# Patient Record
Sex: Female | Born: 1950
Health system: Southern US, Community
[De-identification: ages and names within clinical notes are randomized; demographics above are authoritative.]

## PROBLEM LIST (undated history)

## (undated) DIAGNOSIS — Z9889 Other specified postprocedural states: Secondary | ICD-10-CM

## (undated) DIAGNOSIS — L4050 Arthropathic psoriasis, unspecified: Secondary | ICD-10-CM

## (undated) DIAGNOSIS — M1990 Unspecified osteoarthritis, unspecified site: Secondary | ICD-10-CM

## (undated) DIAGNOSIS — I609 Nontraumatic subarachnoid hemorrhage, unspecified: Secondary | ICD-10-CM

## (undated) DIAGNOSIS — L409 Psoriasis, unspecified: Secondary | ICD-10-CM

## (undated) DIAGNOSIS — K649 Unspecified hemorrhoids: Secondary | ICD-10-CM

## (undated) DIAGNOSIS — R112 Nausea with vomiting, unspecified: Secondary | ICD-10-CM

## (undated) DIAGNOSIS — K56609 Unspecified intestinal obstruction, unspecified as to partial versus complete obstruction: Secondary | ICD-10-CM

## (undated) DIAGNOSIS — I729 Aneurysm of unspecified site: Secondary | ICD-10-CM

## (undated) HISTORY — PX: ANTERIOR CERVICAL DECOMP/DISCECTOMY FUSION: SHX1161

## (undated) HISTORY — PX: ABDOMINAL HYSTERECTOMY: SHX81

## (undated) HISTORY — DX: Unspecified hemorrhoids: K64.9

## (undated) HISTORY — PX: TONSILLECTOMY: SUR1361

---

## 1994-04-17 DIAGNOSIS — I609 Nontraumatic subarachnoid hemorrhage, unspecified: Secondary | ICD-10-CM

## 1994-04-17 DIAGNOSIS — I729 Aneurysm of unspecified site: Secondary | ICD-10-CM

## 1994-04-17 HISTORY — DX: Nontraumatic subarachnoid hemorrhage, unspecified: I60.9

## 1994-04-17 HISTORY — DX: Aneurysm of unspecified site: I72.9

## 1999-11-17 ENCOUNTER — Ambulatory Visit (HOSPITAL_BASED_OUTPATIENT_CLINIC_OR_DEPARTMENT_OTHER): Admission: RE | Admit: 1999-11-17 | Discharge: 1999-11-17 | Payer: Self-pay | Admitting: Orthopedic Surgery

## 2000-02-23 ENCOUNTER — Encounter: Payer: Self-pay | Admitting: Obstetrics & Gynecology

## 2000-02-23 ENCOUNTER — Encounter: Admission: RE | Admit: 2000-02-23 | Discharge: 2000-02-23 | Payer: Self-pay | Admitting: Obstetrics & Gynecology

## 2001-02-26 ENCOUNTER — Encounter: Admission: RE | Admit: 2001-02-26 | Discharge: 2001-02-26 | Payer: Self-pay | Admitting: Obstetrics & Gynecology

## 2001-02-26 ENCOUNTER — Encounter: Payer: Self-pay | Admitting: Obstetrics & Gynecology

## 2002-03-18 ENCOUNTER — Encounter: Payer: Self-pay | Admitting: Obstetrics & Gynecology

## 2002-03-18 ENCOUNTER — Encounter: Admission: RE | Admit: 2002-03-18 | Discharge: 2002-03-18 | Payer: Self-pay | Admitting: Obstetrics & Gynecology

## 2002-03-26 ENCOUNTER — Encounter: Payer: Self-pay | Admitting: Obstetrics & Gynecology

## 2002-03-26 ENCOUNTER — Encounter: Admission: RE | Admit: 2002-03-26 | Discharge: 2002-03-26 | Payer: Self-pay | Admitting: Obstetrics & Gynecology

## 2003-06-05 ENCOUNTER — Encounter: Admission: RE | Admit: 2003-06-05 | Discharge: 2003-06-05 | Payer: Self-pay | Admitting: Obstetrics & Gynecology

## 2005-05-24 ENCOUNTER — Observation Stay (HOSPITAL_COMMUNITY): Admission: EM | Admit: 2005-05-24 | Discharge: 2005-05-27 | Payer: Self-pay | Admitting: Emergency Medicine

## 2005-05-24 DIAGNOSIS — K56609 Unspecified intestinal obstruction, unspecified as to partial versus complete obstruction: Secondary | ICD-10-CM

## 2005-05-24 HISTORY — DX: Unspecified intestinal obstruction, unspecified as to partial versus complete obstruction: K56.609

## 2005-05-26 ENCOUNTER — Observation Stay (HOSPITAL_COMMUNITY): Admission: EM | Admit: 2005-05-26 | Discharge: 2005-05-27 | Payer: Self-pay | Admitting: Internal Medicine

## 2006-09-11 ENCOUNTER — Other Ambulatory Visit: Admission: RE | Admit: 2006-09-11 | Discharge: 2006-09-11 | Payer: Self-pay | Admitting: Geriatric Medicine

## 2007-01-02 ENCOUNTER — Encounter: Admission: RE | Admit: 2007-01-02 | Discharge: 2007-01-02 | Payer: Self-pay | Admitting: Geriatric Medicine

## 2010-09-02 NOTE — Discharge Summary (Signed)
Kylie Peterson, Kylie Peterson               ACCOUNT NO.:  [id number]   MEDICAL RECORD NO.:  [id number]          PATIENT TYPE:  INP   LOCATION:  3028                         FACILITY:  [facility]   PHYSICIAN:  Melissa L. Ladona Ridgel, MD  DATE OF BIRTH:  [birth year]   DATE OF ADMISSION:  05/24/2005  DATE OF DISCHARGE:  05/27/2005                                 DISCHARGE SUMMARY   CHIEF COMPLAINT:  Crampy abdominal pain.   DISCHARGE DIAGNOSIS:  Small-bowel obstruction, possible volvulus.   HOSPITAL COURSE:  The patient was admitted on May 24, 2005 after  developing the onset of acute abdominal pain described as crampy in nature.  Her symptoms started on May 20, 2005.  She had waxing and waning  symptoms until May 22, 2005, when she developed nausea and vomiting.  By  May 23, 2005, she felt better, but then again developed crampy abdominal  pain and came to the emergency room for further evaluation.  In the  emergency room, a CAT scan was completed, which showed a small-bowel  obstruction.  She was admitted to the general medical floor with placement  of an NG tube for decompression, and was made n.p.o.  After 24 hours of  bowel rest, the patient continued to have minimal diffuse abdominal  discomfort, but continued to improve.  Her NG tube was clamped, which she  tolerated well.  She was trialed on clear liquids, which again she continued  to tolerate.  The patient was seen and evaluated by [facility] to determine  whether a procedure would be diagnostically appropriate, especially in light  of the fact that she possibly had a cecal volvulus.  It was determined that  this could be done as an outpatient.  Her NG tube was removed and she was  started back on a regular diet.  The patient had no difficulty with her  advancement of diet.  However, on the day of discharge, she did have an  episode of hypotension without significant symptomatology; however, she was  kept further during that day  for IV fluid rehydration.  In the evening she  was reevaluated and it was determined that she could discharge to home with  followup to Dr. Merlene Laughter, who is her primary care physician.   On that day of discharge, her temperature was 98.1.  Her discharging blood  pressure was between 90-110/58-70.  She was asymptomatic with regard to this  and therefore she was discharged to home.   As stated, she was afebrile at the time of discharge.   PHYSICAL EXAMINATION:  GENERAL:  A well-developed, well-nourished white  female in no acute distress.  HEENT:  She is normocephalic, atraumatic.  Pupils are equal, round and  reactive to light.  Extraocular muscles were intact.  Mucous membranes were  moist.  NECK:  Supple.  There is no JVD, no lymph nodes, no carotid bruits.  CHEST:  Her chest was clear to auscultation.  There were no rales, rhonchi  or wheezes.  CARDIOVASCULAR:  Regular rate and rhythm, positive S1 and S2, no S3 or S4,  no murmurs, rubs,  or gallops.  ABDOMEN:  Soft.  She has trace tenderness in the left lower quadrant without  evidence for hernia or guarding.  EXTREMITIES:  Extremities show no clubbing or cyanosis.   LABORATORY AND ACCESSORY CLINICAL DATA:  As stated, the pertinent studies  for her visits were a CAT scan showing a small-bowel obstruction.   Her discharging sodium was 140, potassium 4.1, BUN was 2, creatinine was  0.9.  Her white count had remained slightly elevated without fever at 15.3,  hemoglobin of 12.4, hematocrit 35.2; platelets were 375,000.   MEDICATIONS AT TIME OF DISCHARGE:  None.   FOLLOWUP:  She was requested to follow up with Dr. Pete Glatter if she were to  become more symptomatic and I will request that she see him in the next week  to follow up on her elevated white cell count.   CONDITION ON DISCHARGE:  At the time of discharge, the patient is deemed  stable.      Melissa L. Ladona Ridgel, MD  Electronically Signed     MLT/MEDQ  D:   05/31/2005  T:  05/31/2005  Job:  [id number]   cc:   Kylie Peterson, M.D.  Fax: [phone]

## 2010-09-02 NOTE — Op Note (Signed)
[place]  Patient:    Kylie Peterson, Kylie Peterson                        MRN: [id number] Proc. Date: 11/17/99 Adm. Date:  [id number] Disc. Date: [id number] Attending:  Ronne Binning CC:         Nicki Reaper, M.D. (2)                           Operative Report  PREOPERATIVE DIAGNOSIS:  Mass, right index-middle web space.  POSTOPERATIVE DIAGNOSIS:  Mass, right index-middle web space.  OPERATION:  Excision of mass second web space, right hand.  SURGEON:  Nicki Reaper, M.D.  ASSISTANT:  Joaquin Courts, R.N.  ANESTHESIA:  Forearm-based IV regional.  ANESTHESIOLOGIST:  Kaylyn Layer. Michelle Piper, M.D.  INDICATIONS:  The patient is a 60 year old female with a history of a mass in the second web space of her right hand.  DESCRIPTION OF PROCEDURE:  The patient was brought to the operating room where a forearm-based IV regional anesthetic was carried out without difficulty. She was prepped and draped using Betadine scrub and solution, with the right arm free.  A longitudinal incision was made over the mass and carried down through the subcutaneous tissue.  Bleeders were electrocauterized.  A multilobulated cystic structure was immediately apparent.  With blunt and sharp dissection this was dissected free and found to enter into the middle finger metacarpal area.  This area was debrided, coagulated.  No further lesions were identified.  The wound was irrigated.  The skin was closed with interrupted #5-0 nylon sutures.  A sterile compressive dressing without a splint was applied.  The patient tolerated the procedure well and was taken to the recovery room for observation in satisfactory condition.  DISPOSITION:  She is discharged home, to return to the [facility] [place] in one week, on Vicodin and Keflex.DD:  11/17/99 TD:  11/19/99 Job: [id number]

## 2010-09-02 NOTE — Consult Note (Signed)
[name], Peterson               ACCOUNT NO.:  [id number]   MEDICAL RECORD NO.:  [id number]          PATIENT TYPE:  EMS   LOCATION:  ED                           FACILITY:  [facility]   PHYSICIAN:  John C. Madilyn Fireman, M.D.    DATE OF BIRTH:  14-Jul-1950   DATE OF CONSULTATION:  05/26/2005  DATE OF DISCHARGE:                                   CONSULTATION   REASON FOR CONSULTATION:  Small bowel obstruction.   HISTORY OF PRESENT ILLNESS:  Patient is a 60 year old white female who  presented with crampy abdominal pain on February 3 with nausea which  resolved but it recurred on February 6 and became worse and associated with  vomiting on February 7 with no diarrhea and no fever.  She did not have  significant abdominal distention and presented to the emergency room where  an abdominal CT scan showed a mechanical small bowel obstruction, no obvious  cause.  NG tube was placed which resulted in good decompression of her  abdomen and on February 8 small bowel obstruction seemed to be resolved by  KUB with some contrast medium in the vicinity of the cecum.  Between the two  studies a volvulus was suspected.  Patient's NG tube was removed today and  her abdomen is currently flat with minimal residual soreness and she has  passed some gas.   PAST MEDICAL HISTORY:  History of subarachnoid hemorrhage.   PAST SURGICAL HISTORY:  Hysterectomy for endometriosis.   ALLERGIES:  CODEINE.   MEDICATIONS:  None.   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  She denies  alcohol or tobacco use.   PHYSICAL EXAMINATION:  GENERAL:  Well-developed, well-nourished white female  in no acute distress.  HEART:  Regular rate and rhythm without murmur.  LUNGS:  Clear.  ABDOMEN:  Soft, nondistended with normoactive bowel sounds.  No  hepatosplenomegaly, mass, or guarding.   IMPRESSION:  Small bowel obstruction, resolved with volvulus suggested.   PLAN:  To rule out other etiologies and satisfy   colon cancer screening  recommendations she clearly warrants colonoscopy.  This can be done  optionally either as an inpatient or as an outpatient, particularly if she  improves and wants to go home as soon as possible.  Discussed all this with  her and will advance diet and as long as she improves will discharge when  clinically ready and follow up in my office and arrange a colonoscopy in the  near future.           ______________________________  Everardo All Madilyn Fireman, M.D.     JCH/MEDQ  D:  05/26/2005  T:  05/26/2005  Job:  [id number]   cc:   Hal T. Stoneking, M.D.  Fax: [phone]

## 2010-09-02 NOTE — H&P (Signed)
NAMESTEFANEE, Kylie Peterson               ACCOUNT NO.:  [id number]   MEDICAL RECORD NO.:  [id number]          PATIENT TYPE:  INP   LOCATION:  0109                         [facility]ACILITY:  [facility]   PHYSICIAN:  Theressa Millard, M.D.    DATE OF BIRTH:  December 24, 1950   DATE OF ADMISSION:  05/24/2005  DATE OF DISCHARGE:                                HISTORY & PHYSICAL   HISTORY OF PRESENT ILLNESS:  Kylie Peterson is a 60 year old white female  admitted with a small bowel obstruction.  She developed crampy abdominal  pain on May 20, 2005.  On May 21, 2005, she felt somewhat better,  but that evening she got worse.  On May 22, 2005, she developed nausea  and vomiting, and vomited approximately 6 times.  On May 23, 2005, she  felt somewhat better, but earlier today developed nausea without vomiting,  and then had a trace abdominal pain that was crampy and came to the  emergency department.  CT scan of the abdomen was consistent with a small  bowel obstruction.  She has had no flatus today, no diarrhea.   PAST MEDICAL HISTORY:  1.  Hysterectomy for endometriosis.  2.  Subarachnoid hemorrhage with right-sided weakness, which was resolved.      No source of subarachnoid hemorrhage found.   ALLERGIES:  CODEINE which causes nausea and vomiting.   MEDICATIONS:  She is on no chronic medications.   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  She does not  smoke.  She does not consume alcohol.   FAMILY HISTORY:  Negative.   REVIEW OF SYSTEMS:  All other systems are negative.   PHYSICAL EXAMINATION:  VITAL SIGNS:  Blood pressure 115/54, pulse 90,  respiratory rate 22, oxygen saturation 99%.  HEENT:  The eyes have pupils which are round and reactive to light.  Ears,  nose and throat are unremarkable.  NECK:  Supple.  Thyroid is not enlarged or tender.  CHEST:  Clear to auscultation and percussion.  CARDIAC:  A normal S1 and S2 without an S3, S4, murmur, rub, or click.  ABDOMEN:   Slightly decreased bowel sounds.  There is mild diffuse  tenderness, somewhat worse in the lower abdomen.  There is no rebound.  EXTREMITIES:  Without clubbing, cyanosis, or edema.   LABORATORY DATA:  CBC is normal.  Sodium of 135, potassium 3.9, BUN 12,  creatinine 0.8.  CT scan shows nondistended loops of small bowel consistent  with a small bowel obstruction.   IMPRESSION:  Small bowel obstruction.  CT consistent with this, although  abdomen is somewhat quiet (on the other hand, she has had pain medications).  Will place a nasogastric tube to low intermittent suction, start IV fluids,  follow x-rays and electrolytes.      Theressa Millard, M.D.  Electronically Signed     JO/MEDQ  D:  05/24/2005  T:  05/24/2005  Job:  [id number]

## 2011-10-24 ENCOUNTER — Encounter (HOSPITAL_BASED_OUTPATIENT_CLINIC_OR_DEPARTMENT_OTHER): Payer: Self-pay | Admitting: *Deleted

## 2011-10-24 ENCOUNTER — Other Ambulatory Visit: Payer: Self-pay | Admitting: Orthopedic Surgery

## 2011-10-24 NOTE — Progress Notes (Addendum)
Requested notes from Dr.Love's office about subarachnoid hemorrhage and they have nothing on pt. Checked E chart for notes and none available from admission for Hemorrhage.

## 2011-10-25 ENCOUNTER — Encounter (HOSPITAL_BASED_OUTPATIENT_CLINIC_OR_DEPARTMENT_OTHER): Payer: Self-pay | Admitting: *Deleted

## 2011-10-25 ENCOUNTER — Encounter (HOSPITAL_BASED_OUTPATIENT_CLINIC_OR_DEPARTMENT_OTHER)
Admission: RE | Disposition: A | Discharge status: Home / Self Care | Payer: Self-pay | Source: Ambulatory Visit | Attending: Orthopedic Surgery

## 2011-10-25 ENCOUNTER — Ambulatory Visit (HOSPITAL_BASED_OUTPATIENT_CLINIC_OR_DEPARTMENT_OTHER): Payer: BC Managed Care – PPO | Admitting: Certified Registered Nurse Anesthetist

## 2011-10-25 ENCOUNTER — Encounter (HOSPITAL_BASED_OUTPATIENT_CLINIC_OR_DEPARTMENT_OTHER): Payer: Self-pay | Admitting: Certified Registered Nurse Anesthetist

## 2011-10-25 ENCOUNTER — Encounter (HOSPITAL_BASED_OUTPATIENT_CLINIC_OR_DEPARTMENT_OTHER): Payer: Self-pay | Admitting: Orthopedic Surgery

## 2011-10-25 ENCOUNTER — Ambulatory Visit (HOSPITAL_BASED_OUTPATIENT_CLINIC_OR_DEPARTMENT_OTHER)
Admission: RE | Admit: 2011-10-25 | Discharge: 2011-10-25 | Disposition: A | Discharge status: Home / Self Care | Payer: BC Managed Care – PPO | Source: Ambulatory Visit | Attending: Orthopedic Surgery | Admitting: Orthopedic Surgery

## 2011-10-25 DIAGNOSIS — M19049 Primary osteoarthritis, unspecified hand: Secondary | ICD-10-CM | POA: Insufficient documentation

## 2011-10-25 DIAGNOSIS — Z8673 Personal history of transient ischemic attack (TIA), and cerebral infarction without residual deficits: Secondary | ICD-10-CM | POA: Insufficient documentation

## 2011-10-25 DIAGNOSIS — M6740 Ganglion, unspecified site: Secondary | ICD-10-CM | POA: Insufficient documentation

## 2011-10-25 DIAGNOSIS — M1990 Unspecified osteoarthritis, unspecified site: Secondary | ICD-10-CM | POA: Insufficient documentation

## 2011-10-25 HISTORY — DX: Other specified postprocedural states: Z98.890

## 2011-10-25 HISTORY — DX: Nontraumatic subarachnoid hemorrhage, unspecified: I60.9

## 2011-10-25 HISTORY — DX: Unspecified osteoarthritis, unspecified site: M19.90

## 2011-10-25 HISTORY — DX: Unspecified intestinal obstruction, unspecified as to partial versus complete obstruction: K56.609

## 2011-10-25 HISTORY — DX: Nausea with vomiting, unspecified: R11.2

## 2011-10-25 HISTORY — PX: EAR CYST EXCISION: SHX22

## 2011-10-25 LAB — POCT HEMOGLOBIN-HEMACUE: Hemoglobin: 13.3 g/dL (ref 12.0–15.0)

## 2011-10-25 SURGERY — CYST REMOVAL
Anesthesia: General | Site: Finger | Laterality: Right | Wound class: Clean

## 2011-10-25 MED ORDER — DEXAMETHASONE SODIUM PHOSPHATE 4 MG/ML IJ SOLN
INTRAMUSCULAR | Status: DC | PRN
  Administered 2011-10-25: 10 mg via INTRAVENOUS

## 2011-10-25 MED ORDER — LIDOCAINE HCL (CARDIAC) 20 MG/ML IV SOLN
INTRAVENOUS | Status: DC | PRN
  Administered 2011-10-25: 60 mg via INTRAVENOUS

## 2011-10-25 MED ORDER — PROPOFOL 10 MG/ML IV EMUL: INTRAVENOUS | Status: DC | PRN

## 2011-10-25 MED ORDER — LACTATED RINGERS IV SOLN
INTRAVENOUS | Status: DC
  Administered 2011-10-25 (×2): via INTRAVENOUS

## 2011-10-25 MED ORDER — FENTANYL CITRATE 0.05 MG/ML IJ SOLN
INTRAMUSCULAR | Status: DC | PRN
  Administered 2011-10-25: 50 ug via INTRAVENOUS

## 2011-10-25 MED ORDER — PENTAZOCINE-NALOXONE 50-0.5 MG PO TABS: 1.0000 | ORAL_TABLET | ORAL | Status: AC | PRN

## 2011-10-25 MED ORDER — 0.9 % SODIUM CHLORIDE (POUR BTL) OPTIME
TOPICAL | Status: DC | PRN
  Administered 2011-10-25: 100 mL

## 2011-10-25 MED ORDER — CEFAZOLIN SODIUM 1-5 GM-% IV SOLN
INTRAVENOUS | Status: DC | PRN
  Administered 2011-10-25: 1 g via INTRAVENOUS

## 2011-10-25 MED ORDER — FENTANYL CITRATE 0.05 MG/ML IJ SOLN: 25.0000 ug | INTRAMUSCULAR | Status: DC | PRN

## 2011-10-25 MED ORDER — CHLORHEXIDINE GLUCONATE 4 % EX LIQD: 60.0000 mL | Freq: Once | CUTANEOUS | Status: DC

## 2011-10-25 MED ORDER — DROPERIDOL 2.5 MG/ML IJ SOLN
INTRAMUSCULAR | Status: DC | PRN
  Administered 2011-10-25: 0.625 mg via INTRAVENOUS

## 2011-10-25 MED ORDER — BUPIVACAINE HCL (PF) 0.25 % IJ SOLN
INTRAMUSCULAR | Status: DC | PRN
  Administered 2011-10-25: 7 mL

## 2011-10-25 MED ORDER — ONDANSETRON HCL 4 MG/2ML IJ SOLN
INTRAMUSCULAR | Status: DC | PRN
  Administered 2011-10-25: 4 mg via INTRAVENOUS

## 2011-10-25 MED ORDER — PROPOFOL 10 MG/ML IV BOLUS
INTRAVENOUS | Status: DC | PRN
  Administered 2011-10-25: 200 mg via INTRAVENOUS

## 2011-10-25 MED ORDER — MIDAZOLAM HCL 5 MG/5ML IJ SOLN
INTRAMUSCULAR | Status: DC | PRN
  Administered 2011-10-25: 1 mg via INTRAVENOUS

## 2011-10-25 SURGICAL SUPPLY — 50 items
BANDAGE COBAN STERILE 2 (GAUZE/BANDAGES/DRESSINGS) IMPLANT
BANDAGE GAUZE ELAST BULKY 4 IN (GAUZE/BANDAGES/DRESSINGS) IMPLANT
BLADE MINI RND TIP GREEN BEAV (BLADE) ×3 IMPLANT
BLADE SURG 15 STRL LF DISP TIS (BLADE) ×2 IMPLANT
BLADE SURG 15 STRL SS (BLADE) ×1
BNDG COHESIVE 1X5 TAN STRL LF (GAUZE/BANDAGES/DRESSINGS) ×3 IMPLANT
BNDG COHESIVE 3X5 TAN STRL LF (GAUZE/BANDAGES/DRESSINGS) IMPLANT
BNDG ESMARK 4X9 LF (GAUZE/BANDAGES/DRESSINGS) ×3 IMPLANT
CHLORAPREP W/TINT 26ML (MISCELLANEOUS) ×3 IMPLANT
CLOTH BEACON ORANGE TIMEOUT ST (SAFETY) ×3 IMPLANT
CORDS BIPOLAR (ELECTRODE) ×3 IMPLANT
COVER MAYO STAND STRL (DRAPES) ×3 IMPLANT
COVER TABLE BACK 60X90 (DRAPES) ×3 IMPLANT
CUFF TOURNIQUET SINGLE 18IN (TOURNIQUET CUFF) ×3 IMPLANT
DECANTER SPIKE VIAL GLASS SM (MISCELLANEOUS) IMPLANT
DRAIN PENROSE 1/2X12 LTX STRL (WOUND CARE) IMPLANT
DRAPE EXTREMITY T 121X128X90 (DRAPE) ×3 IMPLANT
DRAPE SURG 17X23 STRL (DRAPES) ×3 IMPLANT
GAUZE XEROFORM 1X8 LF (GAUZE/BANDAGES/DRESSINGS) ×3 IMPLANT
GLOVE BIO SURGEON STRL SZ 6.5 (GLOVE) ×3 IMPLANT
GLOVE BIOGEL PI IND STRL 7.0 (GLOVE) ×2 IMPLANT
GLOVE BIOGEL PI INDICATOR 7.0 (GLOVE) ×1
GLOVE SURG ORTHO 8.0 STRL STRW (GLOVE) ×3 IMPLANT
GOWN BRE IMP PREV XXLGXLNG (GOWN DISPOSABLE) ×3 IMPLANT
GOWN PREVENTION PLUS XLARGE (GOWN DISPOSABLE) ×3 IMPLANT
NDL SAFETY ECLIPSE 18X1.5 (NEEDLE) IMPLANT
NEEDLE 27GAX1X1/2 (NEEDLE) ×3 IMPLANT
NEEDLE HYPO 18GX1.5 SHARP (NEEDLE)
NS IRRIG 1000ML POUR BTL (IV SOLUTION) ×3 IMPLANT
PACK BASIN DAY SURGERY FS (CUSTOM PROCEDURE TRAY) ×3 IMPLANT
PAD CAST 3X4 CTTN HI CHSV (CAST SUPPLIES) IMPLANT
PADDING CAST ABS 3INX4YD NS (CAST SUPPLIES)
PADDING CAST ABS 4INX4YD NS (CAST SUPPLIES)
PADDING CAST ABS COTTON 3X4 (CAST SUPPLIES) IMPLANT
PADDING CAST ABS COTTON 4X4 ST (CAST SUPPLIES) IMPLANT
PADDING CAST COTTON 3X4 STRL (CAST SUPPLIES)
SPLINT FINGER 5/8X3.25 (SOFTGOODS) ×2 IMPLANT
SPLINT FINGER FOAM 3 9119 05 (SOFTGOODS) ×3
SPLINT PLASTER CAST XFAST 3X15 (CAST SUPPLIES) IMPLANT
SPLINT PLASTER XTRA FASTSET 3X (CAST SUPPLIES)
SPONGE GAUZE 4X4 12PLY (GAUZE/BANDAGES/DRESSINGS) ×6 IMPLANT
STOCKINETTE 4X48 STRL (DRAPES) ×3 IMPLANT
SUT VIC AB 4-0 P2 18 (SUTURE) IMPLANT
SUT VICRYL RAPID 5 0 P 3 (SUTURE) ×3 IMPLANT
SUT VICRYL RAPIDE 4/0 PS 2 (SUTURE) IMPLANT
SYR BULB 3OZ (MISCELLANEOUS) ×3 IMPLANT
SYR CONTROL 10ML LL (SYRINGE) ×3 IMPLANT
TOWEL OR 17X24 6PK STRL BLUE (TOWEL DISPOSABLE) ×3 IMPLANT
UNDERPAD 30X30 INCONTINENT (UNDERPADS AND DIAPERS) ×3 IMPLANT
WATER STERILE IRR 1000ML POUR (IV SOLUTION) IMPLANT

## 2011-10-25 NOTE — Anesthesia Procedure Notes (Signed)
Procedure Name: LMA Insertion Date/Time: 10/25/2011 10:10 AM Performed by: Beulah Matusek D Pre-anesthesia Checklist: Patient identified, Emergency Drugs available, Suction available and Patient being monitored Patient Re-evaluated:Patient Re-evaluated prior to inductionOxygen Delivery Method: Circle System Utilized Preoxygenation: Pre-oxygenation with 100% oxygen Intubation Type: IV induction Ventilation: Mask ventilation without difficulty LMA: LMA inserted LMA Size: 4.0 Number of attempts: 1 Airway Equipment and Method: bite block Placement Confirmation: positive ETCO2 Tube secured with: Tape Dental Injury: Teeth and Oropharynx as per pre-operative assessment

## 2011-10-25 NOTE — Anesthesia Postprocedure Evaluation (Signed)
  Anesthesia Post-op Note  Patient: Kylie Peterson  Procedure(s) Performed: Procedure(s) (LRB): CYST REMOVAL (Right)  Patient Location: PACU  Anesthesia Type: General  Level of Consciousness: awake and alert   Airway and Oxygen Therapy: Patient Spontanous Breathing and Patient connected to face mask oxygen  Post-op Pain: none  Post-op Assessment: Post-op Vital signs reviewed, Patient's Cardiovascular Status Stable, Respiratory Function Stable, Patent Airway and No signs of Nausea or vomiting  Post-op Vital Signs: Reviewed and stable  Complications: No apparent anesthesia complications

## 2011-10-25 NOTE — Anesthesia Preprocedure Evaluation (Signed)
Anesthesia Evaluation  Patient identified by MRN, date of birth, ID band Patient awake    Reviewed: Allergy & Precautions, H&P , NPO status , Patient's Chart, lab work & pertinent test results  History of Anesthesia Complications (+) PONV  Airway Mallampati: II TM Distance: >3 FB Neck ROM: Full    Dental No notable dental hx. (+) Teeth Intact and Dental Advisory Given   Pulmonary neg pulmonary ROS,  breath sounds clear to auscultation  Pulmonary exam normal       Cardiovascular negative cardio ROS  Rhythm:Regular Rate:Normal     Neuro/Psych negative neurological ROS  negative psych ROS   GI/Hepatic negative GI ROS, Neg liver ROS,   Endo/Other  negative endocrine ROS  Renal/GU negative Renal ROS  negative genitourinary   Musculoskeletal   Abdominal   Peds  Hematology negative hematology ROS (+)   Anesthesia Other Findings   Reproductive/Obstetrics negative OB ROS                           Anesthesia Physical Anesthesia Plan  ASA: I  Anesthesia Plan: General   Post-op Pain Management:    Induction: Intravenous  Airway Management Planned: LMA  Additional Equipment:   Intra-op Plan:   Post-operative Plan: Extubation in OR  Informed Consent: I have reviewed the patients History and Physical, chart, labs and discussed the procedure including the risks, benefits and alternatives for the proposed anesthesia with the patient or authorized representative who has indicated his/her understanding and acceptance.   Dental advisory given  Plan Discussed with: CRNA  Anesthesia Plan Comments:         Anesthesia Quick Evaluation  

## 2011-10-25 NOTE — Brief Op Note (Signed)
10/25/2011  10:35 AM  PATIENT:  Kylie Peterson  61 y.o. female  PRE-OPERATIVE DIAGNOSIS:  Mucoid cyst right middle finger distal interphalangeal joint  POST-OPERATIVE DIAGNOSIS:  Mucoid cyst right middle finger distal interphalangeal joint  PROCEDURE:  Procedure(s) (LRB): CYST REMOVAL (Right)  SURGEON:  Surgeon(s) and Role:    * Nicki Reaper, MD - Primary  PHYSICIAN ASSISTANT:   ASSISTANTS: none   ANESTHESIA:   local and general  EBL:  Total I/O In: 1000 [I.V.:1000] Out: -   BLOOD ADMINISTERED:none  DRAINS: none   LOCAL MEDICATIONS USED:  MARCAINE     SPECIMEN:  Excision  DISPOSITION OF SPECIMEN:  PATHOLOGY  COUNTS:  YES  TOURNIQUET:   Total Tourniquet Time Documented: Forearm (Right) - 15 minutes  DICTATION: .Other Dictation: Dictation Number [id number]  PLAN OF CARE: Discharge to home after PACU  PATIENT DISPOSITION:  PACU - hemodynamically stable.

## 2011-10-25 NOTE — Op Note (Signed)
[name], [name]               ACCOUNT NO.:  [id number]  MEDICAL RECORD NO.:  [id number]  LOCATION:                                 FACILITY:  PHYSICIAN:  Cindee Salt, M.D.            DATE OF BIRTH:  DATE OF PROCEDURE:  10/25/2011 DATE OF DISCHARGE:                              OPERATIVE REPORT   PREOPERATIVE DIAGNOSES:  Mucoid cyst, degenerative arthritis, distal interphalangeal joint, right middle finger.  POSTOPERATIVE DIAGNOSES:  Mucoid cyst, degenerative arthritis, distal interphalangeal joint, right middle finger.  OPERATION:  Excision of mucoid cyst, debridement of distal interphalangeal joint, right middle finger.  SURGEON:  Cindee Salt, MD  ANESTHESIA:  General with metacarpal block.  ANESTHESIOLOGIST:  Zenon Mayo, MD  HISTORY:  The patient is a 61 year old female with a history of a large mucoid cyst, distal interphalangeal joint of her left middle finger. She is desirous of having this excised.  Pre, peri, and postoperative course have been discussed along with risks and complications.  She is aware there is no guarantee with the surgery, possibility of infection, recurrence, injury to arteries, nerves, tendons, incomplete relief of symptoms, dystrophy.  In the preoperative area, the patient is seen, the extremity marked by both the patient and surgeon.  Antibiotic given.  DESCRIPTION OF PROCEDURE:  The patient was brought to the operating room where a general anesthetic was carried out without difficulty.  She was prepped using ChloraPrep, supine position, right arm free.  A 3-minute dry time was allowed.  Time-out taken, confirming the patient and procedure.  The limb was exsanguinated with an Esmarch bandage. Tourniquet was inflated to 250 mmHg.  A curvilinear incision was made over the distal interphalangeal joint cyst, carried down along the lateral margin of the finger, carried down through subcutaneous tissue. Bleeders were electrocauterized  with bipolar.  The cyst was immediately encountered beneath the skin.  Blunt and sharp dissection, this was dissected free.  The joint was opened.  Synovectomy performed and debridement of osteophytes performed.  The specimen was sent to Pathology.  The wound was irrigated.  The skin then closed with interrupted 5-0 Vicryl Rapide sutures.  Metacarpal block was then given with 0.25% Marcaine without epinephrine, 7 mL was used.  A sterile compressive dressing, splint to the distal interphalangeal joint was applied.  On deflation of the tourniquet, remaining fingers turned pinked.  She was taken to the recovery room for observation in a satisfactory condition.          ______________________________ Cindee Salt, M.D.     GK/MEDQ  D:  10/25/2011  T:  10/25/2011  Job:  [id number]

## 2011-10-25 NOTE — Op Note (Signed)
Dictated number: [id number]

## 2011-10-25 NOTE — Transfer of Care (Signed)
Immediate Anesthesia Transfer of Care Note  Patient: Kylie Peterson  Procedure(s) Performed: Procedure(s) (LRB): CYST REMOVAL (Right)  Patient Location: PACU  Anesthesia Type: General  Level of Consciousness: awake and patient cooperative  Airway & Oxygen Therapy: Patient Spontanous Breathing and Patient connected to face mask oxygen  Post-op Assessment: Report given to PACU RN and Post -op Vital signs reviewed and stable  Post vital signs: Reviewed and stable  Complications: No apparent anesthesia complications

## 2011-10-25 NOTE — H&P (Signed)
  Kylie Peterson is a 61 year old right hand dominant female referred by Dr. Herb Grays for a consultation with respect to a mass in her right middle finger DIP joint. This has been present for approximately 2 months. She recalls no history of injury. She is complaining of this increasing in discomfort. She has a history of arthritis. She has no history of diabetes, thyroid problems or gout. She has had a fusion of her cervical spine and a history of subarachnoid hemorrhage seen by Dr. Newell Coral.   Past Medical History: She has an allergy to Codeine. She takes no medicines. She has had no surgery other than the surgical fusion.   Family Medical History: Positive for arthritis.  Social History: She does not smoke or drink. She is married and a Management consultant.  Review of Systems: Positive for glasses, ringing in her ears, and stroke, otherwise negative for 14 points.  Kylie Peterson is an 61 y.o. female.   Chief Complaint: mucoid tumor rmr HPI: see above  Past Medical History  Diagnosis Date  . Arthritis     osteoarthritis  . PONV (postoperative nausea and vomiting)   . Subarachnoid hemorrhage     at age 34- has some weakness on right side( not really any residual)  . Small bowel obstruction     05/24/2005    Past Surgical History  Procedure Date  . Anterior cervical decomp/discectomy fusion   . Abdominal hysterectomy     1986  . Tonsillectomy     30 years ago    History reviewed. No pertinent family history. Social History:  reports that she has never smoked. She does not have any smokeless tobacco history on file. She reports that she drinks alcohol. She reports that she does not use illicit drugs.  Allergies:  Allergies  Allergen Reactions  . Darvocet (Propoxyphene-Acetaminophen) Nausea And Vomiting  . Percocet (Oxycodone-Acetaminophen) Nausea And Vomiting  . Vicodin (Hydrocodone-Acetaminophen) Nausea And Vomiting    No prescriptions prior to admission    No results found  for this or any previous visit (from the past 48 hour(s)).  No results found.   Pertinent items are noted in HPI.  Height 5\' 2"  (1.575 m), weight 58.968 kg (130 lb).  General appearance: alert, cooperative and appears stated age Head: Normocephalic, without obvious abnormality Neck: no adenopathy Resp: clear to auscultation bilaterally Cardio: regular rate and rhythm, S1, S2 normal, no murmur, click, rub or gallop GI: soft, non-tender; bowel sounds normal; no masses,  no organomegaly Extremities: extremities normal, atraumatic, no cyanosis or edema Pulses: 2+ and symmetric Skin: Skin color, texture, turgor normal. No rashes or lesions Neurologic: Grossly normal Incision/Wound: na  Assessment/Plan She would like to proceed with excision of mucoid cyst, debridement of distal interphalangeal joint right middle finger.  She is aware there is no guarantee with the surgery, possibility of infection, recurrence, injury to arteries, nerves, tendons, incomplete relief of symptoms and dystrophy.  She would like to proceed.   She is scheduled for excision mucoid cyst with debridement of the joint as an outpatient.  Kylie Peterson R 10/25/2011, 9:24 AM

## 2011-10-27 ENCOUNTER — Encounter (HOSPITAL_BASED_OUTPATIENT_CLINIC_OR_DEPARTMENT_OTHER): Payer: Self-pay | Admitting: Orthopedic Surgery

## 2013-02-19 ENCOUNTER — Encounter: Payer: Self-pay | Admitting: Internal Medicine

## 2013-03-19 ENCOUNTER — Other Ambulatory Visit (INDEPENDENT_AMBULATORY_CARE_PROVIDER_SITE_OTHER): Payer: BC Managed Care – PPO

## 2013-03-19 ENCOUNTER — Encounter: Payer: Self-pay | Admitting: Internal Medicine

## 2013-03-19 ENCOUNTER — Ambulatory Visit (INDEPENDENT_AMBULATORY_CARE_PROVIDER_SITE_OTHER): Payer: BC Managed Care – PPO | Admitting: Internal Medicine

## 2013-03-19 VITALS — BP 100/70 | HR 88 | Ht 63.0 in | Wt 123.0 lb

## 2013-03-19 DIAGNOSIS — R1084 Generalized abdominal pain: Secondary | ICD-10-CM

## 2013-03-19 LAB — LIPASE: Lipase: 16 U/L (ref 11.0–59.0)

## 2013-03-19 MED ORDER — MOVIPREP 100 G PO SOLR: 1.0000 | Freq: Once | ORAL | Status: DC

## 2013-03-19 MED ORDER — HYOSCYAMINE SULFATE 0.125 MG SL SUBL: SUBLINGUAL_TABLET | SUBLINGUAL | Status: DC

## 2013-03-19 NOTE — Progress Notes (Signed)
HISTORY OF PRESENT ILLNESS:  Kylie Peterson is a 62 y.o. female with the below listed medical history who is referred today regarding a 2 month history of abdominal pain. The patient reports having undergone screening colonoscopy at age 61 without significant abnormalities. She was admitted to the hospital in February 2007 with acute abdominal pain and radiographic evidence of a mid small bowel obstruction either on the basis of adhesions or mid gut volvulus. This was treated conservatively and resolved. She does take meloxicam daily for psoriatic arthritis. As well, PPI in the form of omeprazole 20 mg, sporadically, for GI gut prophylaxis. She tells her that she was well until approximately 2 months ago when she developed upper abdominal pain described as sharp waves. The discomfort may occur before meals or after meals. This been quite intense on 3 occasions lasting for hours and associated with vomiting. She felt better after vomiting the first 2 occasions. The last such episode, yesterday, could not resolve improvement. Today she feels "sore". The discomfort generally begins in the upper abdomen but radiates throughout the entire abdomen thereafter. There is also some radiation into the back. She was seen by Dr. Collins Scotland October 22. Routine blood work was obtained as was urinalysis. This was unremarkable. Abdominal ultrasound obtained 02/06/2013 was negative including normal gallbladder. She has had 5 pound weight loss since the onset of symptoms. She notices the pain to some degree daily. Will wake her at night. Increased intestinal gas. She reports a history of chronic stable constipation. More recently loose stools.  REVIEW OF SYSTEMS:  All non-GI ROS negative except for joint pain  Past Medical History  Diagnosis Date  . Arthritis     osteoarthritis  . PONV (postoperative nausea and vomiting)   . Subarachnoid hemorrhage     at age 63- has some weakness on right side( not really any residual)  .  Small bowel obstruction     05/24/2005  . Hemorrhoids     Past Surgical History  Procedure Laterality Date  . Anterior cervical decomp/discectomy fusion    . Abdominal hysterectomy      1986  . Tonsillectomy      30 years ago  . Ear cyst excision  10/25/2011    Procedure: CYST REMOVAL;  Surgeon: Nicki Reaper, MD;  Location: [facility];  Service: Orthopedics;  Laterality: Right;  Excision cyst, debridement distal interphalangeal right middle finger    Social History JILLISA HARRIS  reports that she has never smoked. She has never used smokeless tobacco. She reports that she drinks alcohol. She reports that she does not use illicit drugs.  family history is not on file.  Allergies  Allergen Reactions  . Darvocet [Propoxyphene-Acetaminophen] Nausea And Vomiting  . Percocet [Oxycodone-Acetaminophen] Nausea And Vomiting  . Vicodin [Hydrocodone-Acetaminophen] Nausea And Vomiting       PHYSICAL EXAMINATION: Vital signs: BP 100/70  Pulse 88  Ht 5\' 3"  (1.6 m)  Wt 123 lb (55.792 kg)  BMI 21.79 kg/m2  Constitutional: generally well-appearing, no acute distress Psychiatric: alert and oriented x3, cooperative Eyes: extraocular movements intact, anicteric, conjunctiva pink Mouth: oral pharynx moist, no lesions Neck: supple no lymphadenopathy Cardiovascular: heart regular rate and rhythm, no murmur Lungs: clear to auscultation bilaterally Abdomen: soft, nontender, nondistended, no obvious ascites, no peritoneal signs, normal bowel sounds, no organomegaly Rectal: Deferred until colonoscopy Extremities: no lower extremity edema bilaterally Skin: no lesions on visible extremities Neuro: No focal deficits. No asterixis.    ASSESSMENT:  #1.  Recurrent abdominal pain as described. I am concerned about possible intermittent partial bowel obstruction as had happened in 2007. Other possibilities include ulcer disease, pancreatic related pain, intestinal spasm given issues  with loose stools, or occult colon lesion.   PLAN:  #1. Nexium 40 mg daily. Samples given. This to treat potential acid peptic problems #2. Levsin sublingual as needed, prescribed. #3. Serum lipase today, particularly given extended episode of pain yesterday #4. Contrast-enhanced CT scan of the abdomen and pelvis to assess for possible small bowel lesion and evaluate the pancreas or other causes for pain #5. Upper endoscopy to evaluate pain.The nature of the procedure, as well as the risks, benefits, and alternatives were carefully and thoroughly reviewed with the patient. Ample time for discussion and questions allowed. The patient understood, was satisfied, and agreed to proceed. #6. Colonoscopy to provide colon cancer screening (has been greater than 10 years) and also assess for possible causes for pain.The nature of the procedure, as well as the risks, benefits, and alternatives were carefully and thoroughly reviewed with the patient. Ample time for discussion and questions allowed. The patient understood, was satisfied, and agreed to proceed. Movi prep prescribed. The patient instructed on its use

## 2013-03-19 NOTE — Patient Instructions (Signed)
Your physician has requested that you go to the basement for the following lab work before leaving today:  Lipase  We have sent the following medications to your pharmacy for you to pick up at your convenience: Levsin  You have been given samples of Nexium.  Take one 30 minutes before breakfast daily.  You have been scheduled for a CT scan of the abdomen and pelvis at [facility] ([address]---this is in the same building as Architectural [facility]ologist).   You are scheduled on 03/20/2013 at 8:30am. You should arrive 15 minutes prior to your appointment time for registration. Please follow the written instructions below on the day of your exam:  WARNING: IF YOU ARE ALLERGIC TO IODINE/X-RAY DYE, PLEASE NOTIFY RADIOLOGY IMMEDIATELY AT [phone]! YOU WILL BE GIVEN A 13 HOUR PREMEDICATION PREP.  1) Do not eat or drink anything after 4:30am (4 hours prior to your test) 2) You have been given 2 bottles of oral contrast to drink. The solution may taste better if refrigerated, but do NOT add ice or any other liquid to this solution. Shake well before drinking.    Drink 1 bottle of contrast @ 6:30am (2 hours prior to your exam)  Drink 1 bottle of contrast @ 7:30am (1 hour prior to your exam)  You may take any medications as prescribed with a small amount of water except for the following: Metformin, Glucophage, Glucovance, Avandamet, Riomet, Fortamet, Actoplus Met, Janumet, Glumetza or Metaglip. The above medications must be held the day of the exam AND 48 hours after the exam.  The purpose of you drinking the oral contrast is to aid in the visualization of your intestinal tract. The contrast solution may cause some diarrhea. Before your exam is started, you will be given a small amount of fluid to drink. Depending on your individual set of symptoms, you may also receive an intravenous injection of x-ray contrast/dye. Plan on being at [facility] for 30 minutes or long, depending on the  type of exam you are having performed.  If you have any questions regarding your exam or if you need to reschedule, you may call the CT department at [phone] between the hours of 8:00 am and 5:00 pm, Monday-Friday.  ________________________________________________________________________     Kylie Peterson have been scheduled for an endoscopy and colonoscopy with propofol. Please follow the written instructions given to you at your visit today. Please pick up your prep at the pharmacy within the next 1-3 days. If you use inhalers (even only as needed), please bring them with you on the day of your procedure. Your physician has requested that you go to www.startemmi.com and enter the access code given to you at your visit today. This web site gives a general overview about your procedure. However, you should still follow specific instructions given to you by our office regarding your preparation for the procedure.

## 2013-03-20 ENCOUNTER — Ambulatory Visit (INDEPENDENT_AMBULATORY_CARE_PROVIDER_SITE_OTHER)
Admission: RE | Admit: 2013-03-20 | Discharge: 2013-03-20 | Disposition: A | Discharge status: Home / Self Care | Payer: BC Managed Care – PPO | Source: Ambulatory Visit | Attending: Internal Medicine | Admitting: Internal Medicine

## 2013-03-20 DIAGNOSIS — R1084 Generalized abdominal pain: Secondary | ICD-10-CM

## 2013-03-20 MED ORDER — IOHEXOL 300 MG/ML  SOLN
100.0000 mL | Freq: Once | INTRAMUSCULAR | Status: AC | PRN
  Administered 2013-03-20: 100 mL via INTRAVENOUS

## 2013-04-21 ENCOUNTER — Encounter: Payer: Self-pay | Admitting: Internal Medicine

## 2013-04-21 ENCOUNTER — Ambulatory Visit (AMBULATORY_SURGERY_CENTER): Payer: BC Managed Care – PPO | Admitting: Internal Medicine

## 2013-04-21 VITALS — BP 105/69 | HR 71 | Temp 98.2°F | Resp 17 | Ht 63.0 in | Wt 123.0 lb

## 2013-04-21 DIAGNOSIS — R112 Nausea with vomiting, unspecified: Secondary | ICD-10-CM

## 2013-04-21 DIAGNOSIS — R1084 Generalized abdominal pain: Secondary | ICD-10-CM

## 2013-04-21 DIAGNOSIS — Z1211 Encounter for screening for malignant neoplasm of colon: Secondary | ICD-10-CM

## 2013-04-21 MED ORDER — SODIUM CHLORIDE 0.9 % IV SOLN: 500.0000 mL | INTRAVENOUS | Status: DC

## 2013-04-21 NOTE — Op Note (Signed)
[facility] [address] [place], [address]   COLONOSCOPY PROCEDURE REPORT  PATIENT: Kylie Peterson, Kylie Peterson  MR#: [id number] BIRTHDATE: 1951-04-16 , 62  yrs. old GENDER: Female ENDOSCOPIST: Roxy Cedar, MD REFERRED OT:RRNHA Spear, M.D. PROCEDURE DATE:  04/21/2013 PROCEDURE:   Colonoscopy, screening First Screening Colonoscopy - Avg.  risk and is 50 yrs.  old or older - No.  Prior Negative Screening - Now for repeat screening. 10 or more years since last screening  History of Adenoma - Now for follow-up colonoscopy & has been > or = to 3 yrs.  N/A  Polyps Removed Today? No.  Recommend repeat exam, <10 yrs? No. ASA CLASS:   Class I INDICATIONS:average risk screening.   Last exam 12 years ago (negative per patient) MEDICATIONS: MAC sedation, administered by CRNA and propofol (Diprivan) 400mg  IV  DESCRIPTION OF PROCEDURE:   After the risks benefits and alternatives of the procedure were thoroughly explained, informed consent was obtained.  A digital rectal exam revealed no abnormalities of the rectum.   The LB [id number]  endoscope was introduced through the anus and advanced to the cecum, which was identified by both the appendix and ileocecal valve. No adverse events experienced.   The quality of the prep was excellent, using MoviPrep  The instrument was then slowly withdrawn as the colon was fully examined.  COLON FINDINGS: The mucosa appeared normal in the terminal ileum. A normal appearing cecum, ileocecal valve, and appendiceal orifice were identified.  The ascending, hepatic flexure, transverse, splenic flexure, descending, sigmoid colon and rectum appeared unremarkable.  No polyps or cancers were seen.  Retroflexed views revealed internal hemorrhoids. The time to cecum=6 minutes 35 seconds.  Withdrawal time=9 minutes 16 seconds.  The scope was withdrawn and the procedure completed. COMPLICATIONS: There were no complications.  ENDOSCOPIC  IMPRESSION: 1.   Normal mucosa in the terminal ileum 2.   Normal colon  RECOMMENDATIONS: 1.  Continue current colorectal screening recommendations for "routine risk" patients with a repeat colonoscopy in 10 years. 2.  Upper endoscopy today (SEE REPORT)   eSigned:  [id number], MD 04/21/2013 4:26 PM   cc: 06/19/2013, MD and The Patient

## 2013-04-21 NOTE — Op Note (Signed)
[facility] [address] [place], [address]   ENDOSCOPY PROCEDURE REPORT  PATIENT: Kylie Peterson, Kylie Peterson  MR#: [id number] BIRTHDATE: 11-Jul-1950 , 62  yrs. old GENDER: Female ENDOSCOPIST: Roxy Cedar, MD REFERRED BY:  Herb Grays, M.D. PROCEDURE DATE:  04/21/2013 PROCEDURE:  EGD, diagnostic ASA CLASS:     Class I INDICATIONS:  abdominal pain.   Nausea.   Vomiting. MEDICATIONS: MAC sedation, administered by CRNA and propofol (Diprivan) 70mg  IV TOPICAL ANESTHETIC: Cetacaine Spray  DESCRIPTION OF PROCEDURE: After the risks benefits and alternatives of the procedure were thoroughly explained, informed consent was obtained.  The LB [id number] endoscope was introduced through the mouth and advanced to the second portion of the duodenum. Without limitations.  The instrument was slowly withdrawn as the mucosa was fully examined.      EXAM: The upper, middle and distal third of the esophagus were carefully inspected and no abnormalities were noted.  The z-line was well seen at the GEJ.  The endoscope was pushed into the fundus which was normal including a retroflexed view.  The antrum, gastric body, first and second part of the duodenum were unremarkable. Retroflexed views revealed a hiatal hernia.     The scope was then withdrawn from the patient and the procedure completed.  COMPLICATIONS: There were no complications.  ENDOSCOPIC IMPRESSION: 1. Normal EGD  RECOMMENDATIONS: 1. Call office next 2-3 days to schedule an office appointment for routine 2 months. For significant persistent recurrent symptoms call office or go to ER as you may be experiencing bowel obstruction  REPEAT EXAM:  eSigned:  [id number], MD 04/21/2013 4:31 PM   CC:The Patient and 06/19/2013, MD

## 2013-04-21 NOTE — Patient Instructions (Signed)
Call the office in next 2-3 days to schedule appointment in two months. For significant persistent recurrent symptoms call our office or go to the E.R. As you may be experiencing bowel obstruction.     YOU HAD AN ENDOSCOPIC PROCEDURE TODAY AT THE [facility]: Refer to the procedure report that was given to you for any specific questions about what was found during the examination.  If the procedure report does not answer your questions, please call your gastroenterologist to clarify.  If you requested that your care partner not be given the details of your procedure findings, then the procedure report has been included in a sealed envelope for you to review at your convenience later.  YOU SHOULD EXPECT: Some feelings of bloating in the abdomen. Passage of more gas than usual.  Walking can help get rid of the air that was put into your GI tract during the procedure and reduce the bloating. If you had a lower endoscopy (such as a colonoscopy or flexible sigmoidoscopy) you may notice spotting of blood in your stool or on the toilet paper. If you underwent a bowel prep for your procedure, then you may not have a normal bowel movement for a few days.  DIET: Your first meal following the procedure should be a light meal and then it is ok to progress to your normal diet.  A half-sandwich or bowl of soup is an example of a good first meal.  Heavy or fried foods are harder to digest and may make you feel nauseous or bloated.  Likewise meals heavy in dairy and vegetables can cause extra gas to form and this can also increase the bloating.  Drink plenty of fluids but you should avoid alcoholic beverages for 24 hours.  ACTIVITY: Your care partner should take you home directly after the procedure.  You should plan to take it easy, moving slowly for the rest of the day.  You can resume normal activity the day after the procedure however you should NOT DRIVE or use heavy machinery for 24 hours (because  of the sedation medicines used during the test).    SYMPTOMS TO REPORT IMMEDIATELY: A gastroenterologist can be reached at any hour.  During normal business hours, 8:30 AM to 5:00 PM Monday through Friday, call [phone].  After hours and on weekends, please call the GI answering service at [phone] who will take a message and have the physician on call contact you.   Following lower endoscopy (colonoscopy or flexible sigmoidoscopy):  Excessive amounts of blood in the stool  Significant tenderness or worsening of abdominal pains  Swelling of the abdomen that is new, acute  Fever of 100F or higher  Following upper endoscopy (EGD)  Vomiting of blood or coffee ground material  New chest pain or pain under the shoulder blades  Painful or persistently difficult swallowing  New shortness of breath  Fever of 100F or higher  Black, tarry-looking stools  FOLLOW UP: If any biopsies were taken you will be contacted by phone or by letter within the next 1-3 weeks.  Call your gastroenterologist if you have not heard about the biopsies in 3 weeks.  Our staff will call the home number listed on your records the next business day following your procedure to check on you and address any questions or concerns that you may have at that time regarding the information given to you following your procedure. This is a courtesy call and so if there is no answer  at the home number and we have not heard from you through the emergency physician on call, we will assume that you have returned to your regular daily activities without incident.  SIGNATURES/CONFIDENTIALITY: You and/or your care partner have signed paperwork which will be entered into your electronic medical record.  These signatures attest to the fact that that the information above on your After Visit Summary has been reviewed and is understood.  Full responsibility of the confidentiality of this discharge information lies with you and/or  your care-partner.

## 2013-04-21 NOTE — Progress Notes (Signed)
Report to pacu rn, vss, bbs=clear 

## 2013-04-22 ENCOUNTER — Telehealth: Payer: Self-pay

## 2013-04-22 NOTE — Telephone Encounter (Signed)
No answer, left message to call [facility] if questions or concerns following procedure on 04/21/2013 

## 2013-04-24 ENCOUNTER — Encounter: Payer: BC Managed Care – PPO | Admitting: Internal Medicine

## 2013-05-22 ENCOUNTER — Encounter: Payer: Self-pay | Admitting: Internal Medicine

## 2013-05-22 ENCOUNTER — Ambulatory Visit (INDEPENDENT_AMBULATORY_CARE_PROVIDER_SITE_OTHER): Payer: BC Managed Care – PPO | Admitting: Internal Medicine

## 2013-05-22 VITALS — BP 100/60 | HR 76 | Ht 63.0 in | Wt 124.4 lb

## 2013-05-22 DIAGNOSIS — R112 Nausea with vomiting, unspecified: Secondary | ICD-10-CM

## 2013-05-22 DIAGNOSIS — R933 Abnormal findings on diagnostic imaging of other parts of digestive tract: Secondary | ICD-10-CM

## 2013-05-22 DIAGNOSIS — R109 Unspecified abdominal pain: Secondary | ICD-10-CM

## 2013-05-22 NOTE — Patient Instructions (Signed)
You will be contacted regarding a surgical consultation with Dr. Johna Sheriff at [facility]

## 2013-05-22 NOTE — Progress Notes (Signed)
HISTORY OF PRESENT ILLNESS:  Kylie Peterson is a pleasant  63 y.o. female with prior history of partial small bowel obstruction, managed medically, February 2007 who presents today for followup regarding recurrent abdominal pain. She was initially evaluated in the office 03/19/2013. See that dictation. The clinical concern was possible intermittent partial bowel obstruction. She was empirically placed on PPI and given an antispasmodic. A contrast-enhanced CT scan of the abdomen and pelvis was performed and revealed no evidence of small bowel obstruction but did mention nonspecific mild stranding of the fat surrounding the central mesenteric vessels. Previous ultrasound in October was normal. She is status post hysterectomy. She subsequently underwent colonoscopy with intubation of the terminal ileum and upper endoscopy. These were both normal. Routine followup in the office in 2 months recommended. Also, told to present herself to the hospital for significant recurrent pain in order to obtain imaging studies. The patient tells me that she was well until January 30 around 2 PM when she developed epigastric discomfort. This waxed and waned. However, it worsened through the night culminating in 4 episodes of vomiting. After the fourth episode, she felt better. The next day, and that fatigue with less of an appetite. However, within 2 days feeling perfectly fine. She wanted to make this appointment to discuss what she might do about this problem. She inquired about Crohn's disease, intestinal gas, or any other diagnoses, that I might entertain, to explain her problem. She did try antispasmodic previously without improvement. She has now had about 4-6 isolated episodes of pain in the past 4-5 months.  REVIEW OF SYSTEMS:  All non-GI ROS negative except for arthritis  Past Medical History  Diagnosis Date  . Arthritis     osteoarthritis  . PONV (postoperative nausea and vomiting)   . Subarachnoid hemorrhage      at age 27- has some weakness on right side( not really any residual)  . Small bowel obstruction     05/24/2005  . Hemorrhoids     Past Surgical History  Procedure Laterality Date  . Anterior cervical decomp/discectomy fusion    . Abdominal hysterectomy      1986  . Tonsillectomy      30 years ago  . Ear cyst excision  10/25/2011    Procedure: CYST REMOVAL;  Surgeon: Nicki Reaper, MD;  Location: [facility];  Service: Orthopedics;  Laterality: Right;  Excision cyst, debridement distal interphalangeal right middle finger    Social History [name]  reports that she has never smoked. She has never used smokeless tobacco. She reports that she drinks alcohol. She reports that she does not use illicit drugs.  family history is not on file.  Allergies  Allergen Reactions  . Darvocet [Propoxyphene N-Acetaminophen] Nausea And Vomiting  . Percocet [Oxycodone-Acetaminophen] Nausea And Vomiting  . Vicodin [Hydrocodone-Acetaminophen] Nausea And Vomiting       PHYSICAL EXAMINATION: Vital signs: BP 100/60  Pulse 76  Ht 5\' 3"  (1.6 m)  Wt 124 lb 6.4 oz (56.427 kg)  BMI 22.04 kg/m2 General: Well-developed, well-nourished, no acute distress HEENT: Sclerae are anicteric, conjunctiva pink. Oral mucosa intact Lungs: Clear Heart: Regular Abdomen: soft, nontender, nondistended, no obvious ascites, no peritoneal signs, normal bowel sounds. No organomegaly. Extremities: No edema Psychiatric: alert and oriented x3. Cooperative   ASSESSMENT:  #1. Recurrent episodic abdominal pain with vomiting as described. Prior history of partial small bowel obstruction 2007. CT scan with nonspecific stranding of the fat surrounding the central mesenteric vessels.  Negative extensive workup including laboratories, x-rays, and endoscopic procedures. My clinical concern remains intermittent partial bowel obstruction as the cause for pain and vomiting. Obviously, if this is the case, there are  no medical therapies. She understands. We did discuss prospects of elective surgical consultation for a surgical opinion. Furthermore, should she need surgery at some point, she will have that relationship established. I have recommended a surgical opinion with Dr. Glenna Fellows. She is quite agreeable.

## 2013-06-13 ENCOUNTER — Ambulatory Visit (INDEPENDENT_AMBULATORY_CARE_PROVIDER_SITE_OTHER): Payer: BC Managed Care – PPO | Admitting: General Surgery

## 2013-06-13 ENCOUNTER — Encounter (INDEPENDENT_AMBULATORY_CARE_PROVIDER_SITE_OTHER): Payer: Self-pay | Admitting: General Surgery

## 2013-06-13 VITALS — BP 120/78 | HR 78 | Temp 98.0°F | Resp 18 | Ht 64.0 in | Wt 123.0 lb

## 2013-06-13 DIAGNOSIS — R109 Unspecified abdominal pain: Secondary | ICD-10-CM

## 2013-06-13 NOTE — Progress Notes (Signed)
Chief complaint: Recurrent abdominal pain  History: Patient is a very pleasant 63 year old female referred by Dr. Marina Goodell for recurrent episodes of abdominal pain, suspicious for intermittent bowel obstruction. The patient has a history of total abdominal hysterectomy in the 1980s for endometriosis. She did well following this but in 2007 she was hospitalized for a documented small bowel obstruction by her history and was treated with NG suction and bowel rest and resolved over about 24-48 hours. She did well following this with just some rare intermittent nonspecific abdominal pain. However over about the past 6 months she has had recurrent significant abdominal pain. She describes the onset of pressure or gas-like pain in her midabdomen and epigastrium which progresses to severe twisting or cramping colicky pain and is followed by vomiting. Typically these episodes the last from a few hours but recently one lasted about 8 hours. She has not gone to the emergency room because she was concerned about having to have surgery. The last episode was so severe that she almost went to the emergency room. Between episodes she feels essentially well.  She has had a thorough workup to date including a CT scan showing no abnormalities or evidence of bowel obstruction and a negative gallbladder ultrasound as well as unrevealing EGD and colonoscopy. None of these studies were done during an episode of pain.  Past Medical History  Diagnosis Date  . Arthritis     osteoarthritis  . PONV (postoperative nausea and vomiting)   . Subarachnoid hemorrhage     at age 75- has some weakness on right side( not really any residual)  . Small bowel obstruction     05/24/2005  . Hemorrhoids    Past Surgical History  Procedure Laterality Date  . Anterior cervical decomp/discectomy fusion    . Abdominal hysterectomy      1986  . Tonsillectomy      30 years ago  . Ear cyst excision  10/25/2011    Procedure: CYST REMOVAL;   Surgeon: Nicki Reaper, MD;  Location: [facility];  Service: Orthopedics;  Laterality: Right;  Excision cyst, debridement distal interphalangeal right middle finger   Current Outpatient Prescriptions  Medication Sig Dispense Refill  . meloxicam (MOBIC) 15 MG tablet Take 15 mg by mouth daily.      . Multiple Vitamin (MULTIVITAMIN) tablet Take 1 tablet by mouth daily.      Marland Kitchen omeprazole (PRILOSEC OTC) 20 MG tablet Take 20 mg by mouth daily.      . hyoscyamine (LEVSIN SL) 0.125 MG SL tablet Take one to two tablets sublingually every 4 hours as needed for pain  30 tablet  0   No current facility-administered medications for this visit.   Exam: BP 120/78  Pulse 78  Temp(Src) 98 F (36.7 C)  Resp 18  Ht 5\' 4"  (1.626 m)  Wt 123 lb (55.792 kg)  BMI 21.10 kg/m2 General: Thin healthy-appearing Caucasian female who appears younger than her age Skin: No rash infection HEENT: No couple masses or thyromegaly. Lymph nodes: No cervical, subclavicular or inguinal nodes palpable Lungs: Clear equal breath sounds without increased work of breathing Abdomen: Well-healed small localization from remote laparoscopy and well-healed Pfannenstiel incision. No hernias. Bowel sounds are normal. There is some mild fullness and mild tenderness just to the left of the umbilicus. She is very thin and easy to examine this feels consistent with possibly a slightly dilated loop of bowel. No organomegaly or other masses.  Assessment and plan: Recurrent episodes of  abdominal pain and vomiting now occurring about once per month for the last 6 months that are highly suggestive of intermittent small bowel obstruction. She does have a documented history of previous obstruction in 2007 that was treated nonoperatively. Her exam suggests possibly a dilated loop of bowel in the midabdomen. All this I think is quite suspicious for intermittent obstruction secondary to adhesions. We discussed that her studies have not  proven this diagnosis and that surgical intervention would have uncertain results as we do not have a definite diagnosis and even if we found adhesions and scarring causing obstruction there is no guarantee that this could not recur or even worsen after surgery. However, her symptoms are so frequent and her presentation so suggestive but I do not think it would be unreasonable to consider laparoscopy and lysis of adhesions. I don't think she has any immediate danger. This would really be determined on her decision of how frequent and severe her symptoms are. For now she will think over her options and we will observe closely. I told her that if she gets recurrent symptoms she should go immediately to the emergency room so that we can try to document with imaging, plain films and/or CT scan, any evidence of small bowel dilatation. If she feels that her symptoms are so frequent and severe but they are not tolerable I would certainly be willing to discuss laparoscopic lysis of adhesions. I have given her an appointment to return appointment in 6 months but she understands she can call at any time for questions or concerns.Marland Kitchen

## 2013-06-13 NOTE — Patient Instructions (Signed)
If symptoms recur, go to emergency room for x-rays Could consider laparoscopy if symptoms persist or worsen.

## 2013-06-17 ENCOUNTER — Encounter: Payer: Self-pay | Admitting: *Deleted

## 2013-06-23 ENCOUNTER — Ambulatory Visit: Payer: BC Managed Care – PPO | Admitting: Internal Medicine

## 2013-07-29 ENCOUNTER — Ambulatory Visit: Payer: BC Managed Care – PPO | Admitting: Internal Medicine

## 2013-09-17 ENCOUNTER — Observation Stay (HOSPITAL_COMMUNITY)
Admission: EM | Admit: 2013-09-17 | Discharge: 2013-09-20 | Disposition: A | Discharge status: Home / Self Care | Payer: BC Managed Care – PPO | Attending: Surgery | Admitting: Surgery

## 2013-09-17 ENCOUNTER — Emergency Department (HOSPITAL_COMMUNITY): Payer: BC Managed Care – PPO

## 2013-09-17 ENCOUNTER — Encounter (HOSPITAL_COMMUNITY): Payer: Self-pay | Admitting: Emergency Medicine

## 2013-09-17 DIAGNOSIS — K565 Intestinal adhesions [bands], unspecified as to partial versus complete obstruction: Principal | ICD-10-CM | POA: Insufficient documentation

## 2013-09-17 DIAGNOSIS — K56609 Unspecified intestinal obstruction, unspecified as to partial versus complete obstruction: Secondary | ICD-10-CM

## 2013-09-17 DIAGNOSIS — D72829 Elevated white blood cell count, unspecified: Secondary | ICD-10-CM | POA: Insufficient documentation

## 2013-09-17 DIAGNOSIS — L4050 Arthropathic psoriasis, unspecified: Secondary | ICD-10-CM | POA: Insufficient documentation

## 2013-09-17 DIAGNOSIS — Z79899 Other long term (current) drug therapy: Secondary | ICD-10-CM | POA: Insufficient documentation

## 2013-09-17 DIAGNOSIS — Z8673 Personal history of transient ischemic attack (TIA), and cerebral infarction without residual deficits: Secondary | ICD-10-CM | POA: Insufficient documentation

## 2013-09-17 DIAGNOSIS — R29898 Other symptoms and signs involving the musculoskeletal system: Secondary | ICD-10-CM | POA: Insufficient documentation

## 2013-09-17 DIAGNOSIS — R1032 Left lower quadrant pain: Secondary | ICD-10-CM | POA: Insufficient documentation

## 2013-09-17 DIAGNOSIS — R112 Nausea with vomiting, unspecified: Secondary | ICD-10-CM | POA: Insufficient documentation

## 2013-09-17 HISTORY — DX: Aneurysm of unspecified site: I72.9

## 2013-09-17 HISTORY — DX: Psoriasis, unspecified: L40.9

## 2013-09-17 HISTORY — DX: Arthropathic psoriasis, unspecified: L40.50

## 2013-09-17 LAB — URINE MICROSCOPIC-ADD ON

## 2013-09-17 LAB — COMPREHENSIVE METABOLIC PANEL
ALT: 12 U/L (ref 0–35)
AST: 13 U/L (ref 0–37)
Albumin: 4.5 g/dL (ref 3.5–5.2)
Alkaline Phosphatase: 54 U/L (ref 39–117)
BILIRUBIN TOTAL: 1 mg/dL (ref 0.3–1.2)
BUN: 33 mg/dL — AB (ref 6–23)
CHLORIDE: 89 meq/L — AB (ref 96–112)
CO2: 33 meq/L — AB (ref 19–32)
CREATININE: 0.96 mg/dL (ref 0.50–1.10)
Calcium: 10.3 mg/dL (ref 8.4–10.5)
GFR, EST AFRICAN AMERICAN: 71 mL/min — AB (ref >90)
GFR, EST NON AFRICAN AMERICAN: 62 mL/min — AB (ref >90)
GLUCOSE: 126 mg/dL — AB (ref 70–99)
Potassium: 3.7 mEq/L (ref 3.7–5.3)
Sodium: 134 mEq/L — ABNORMAL LOW (ref 137–147)
Total Protein: 7.5 g/dL (ref 6.0–8.3)

## 2013-09-17 LAB — CBC WITH DIFFERENTIAL/PLATELET
BASOS PCT: 0 % (ref 0–1)
Basophils Absolute: 0 10*3/uL (ref 0.0–0.1)
Eosinophils Absolute: 0 10*3/uL (ref 0.0–0.7)
Eosinophils Relative: 0 % (ref 0–5)
HCT: 45.2 % (ref 36.0–46.0)
Hemoglobin: 15.5 g/dL — ABNORMAL HIGH (ref 12.0–15.0)
LYMPHS PCT: 5 % — AB (ref 12–46)
Lymphs Abs: 0.7 10*3/uL (ref 0.7–4.0)
MCH: 29 pg (ref 26.0–34.0)
MCHC: 34.3 g/dL (ref 30.0–36.0)
MCV: 84.6 fL (ref 78.0–100.0)
MONO ABS: 1.3 10*3/uL — AB (ref 0.1–1.0)
MONOS PCT: 9 % (ref 3–12)
Neutro Abs: 12.4 10*3/uL — ABNORMAL HIGH (ref 1.7–7.7)
Neutrophils Relative %: 86 % — ABNORMAL HIGH (ref 43–77)
Platelets: 343 10*3/uL (ref 150–400)
RBC: 5.34 MIL/uL — ABNORMAL HIGH (ref 3.87–5.11)
RDW: 12.5 % (ref 11.5–15.5)
WBC: 14.4 10*3/uL — ABNORMAL HIGH (ref 4.0–10.5)

## 2013-09-17 LAB — LIPASE, BLOOD: LIPASE: 16 U/L (ref 11–59)

## 2013-09-17 LAB — URINALYSIS, ROUTINE W REFLEX MICROSCOPIC
Bilirubin Urine: NEGATIVE
Glucose, UA: NEGATIVE mg/dL
Ketones, ur: 40 mg/dL — AB
LEUKOCYTES UA: NEGATIVE
Nitrite: NEGATIVE
PROTEIN: NEGATIVE mg/dL
Specific Gravity, Urine: >1.046 — ABNORMAL HIGH (ref 1.005–1.030)
UROBILINOGEN UA: 1 mg/dL (ref 0.0–1.0)
pH: 7 (ref 5.0–8.0)

## 2013-09-17 LAB — I-STAT CG4 LACTIC ACID, ED: LACTIC ACID, VENOUS: 1.65 mmol/L (ref 0.5–2.2)

## 2013-09-17 MED ORDER — ACETAMINOPHEN 500 MG PO TABS
1000.0000 mg | ORAL_TABLET | Freq: Once | ORAL | Status: AC
  Administered 2013-09-17: 1000 mg via ORAL
  Filled 2013-09-17: qty 2

## 2013-09-17 MED ORDER — DIPHENHYDRAMINE HCL 12.5 MG/5ML PO ELIX: 12.5000 mg | ORAL_SOLUTION | Freq: Four times a day (QID) | ORAL | Status: DC | PRN

## 2013-09-17 MED ORDER — LIDOCAINE HCL 2 % EX GEL
CUTANEOUS | Status: AC
  Filled 2013-09-17: qty 10

## 2013-09-17 MED ORDER — ONDANSETRON HCL 4 MG/2ML IJ SOLN
4.0000 mg | Freq: Four times a day (QID) | INTRAMUSCULAR | Status: DC | PRN
  Administered 2013-09-17 – 2013-09-19 (×5): 4 mg via INTRAVENOUS
  Filled 2013-09-17 (×5): qty 2

## 2013-09-17 MED ORDER — IOHEXOL 300 MG/ML  SOLN
100.0000 mL | Freq: Once | INTRAMUSCULAR | Status: AC | PRN
  Administered 2013-09-17: 100 mL via INTRAVENOUS

## 2013-09-17 MED ORDER — MORPHINE SULFATE 2 MG/ML IJ SOLN
1.0000 mg | INTRAMUSCULAR | Status: DC | PRN
  Administered 2013-09-17 – 2013-09-19 (×7): 2 mg via INTRAVENOUS
  Filled 2013-09-17 (×7): qty 1

## 2013-09-17 MED ORDER — POTASSIUM CHLORIDE IN NACL 20-0.9 MEQ/L-% IV SOLN
INTRAVENOUS | Status: DC
  Administered 2013-09-17 – 2013-09-18 (×3): via INTRAVENOUS
  Filled 2013-09-17 (×5): qty 1000

## 2013-09-17 MED ORDER — DIPHENHYDRAMINE HCL 50 MG/ML IJ SOLN: 12.5000 mg | Freq: Four times a day (QID) | INTRAMUSCULAR | Status: DC | PRN

## 2013-09-17 MED ORDER — ONDANSETRON HCL 4 MG/2ML IJ SOLN
4.0000 mg | Freq: Once | INTRAMUSCULAR | Status: AC
  Administered 2013-09-17: 4 mg via INTRAVENOUS
  Filled 2013-09-17: qty 2

## 2013-09-17 MED ORDER — SODIUM CHLORIDE 0.9 % IV SOLN: INTRAVENOUS | Status: DC

## 2013-09-17 MED ORDER — ACETAMINOPHEN 325 MG PO TABS
650.0000 mg | ORAL_TABLET | Freq: Four times a day (QID) | ORAL | Status: DC | PRN
  Administered 2013-09-20: 650 mg via ORAL
  Filled 2013-09-17: qty 2

## 2013-09-17 MED ORDER — DEXTROSE 5 % IV SOLN
2.0000 g | INTRAVENOUS | Status: AC
  Administered 2013-09-18: 2 g via INTRAVENOUS
  Filled 2013-09-17: qty 2

## 2013-09-17 MED ORDER — IOHEXOL 300 MG/ML  SOLN
50.0000 mL | Freq: Once | INTRAMUSCULAR | Status: AC | PRN
  Administered 2013-09-17: 50 mL via ORAL

## 2013-09-17 MED ORDER — LIP MEDEX EX OINT
TOPICAL_OINTMENT | CUTANEOUS | Status: AC
  Administered 2013-09-17: 18:00:00
  Filled 2013-09-17: qty 7

## 2013-09-17 MED ORDER — SODIUM CHLORIDE 0.9 % IV BOLUS (SEPSIS): 500.0000 mL | Freq: Once | INTRAVENOUS | Status: DC

## 2013-09-17 MED ORDER — SODIUM CHLORIDE 0.9 % IV BOLUS (SEPSIS)
1000.0000 mL | Freq: Once | INTRAVENOUS | Status: AC
  Administered 2013-09-17: 1000 mL via INTRAVENOUS

## 2013-09-17 NOTE — ED Notes (Signed)
Patient transported to CT 

## 2013-09-17 NOTE — Consult Note (Signed)
Kylie Peterson 1950-08-26  [id number].    Requesting MD: Dr. Regenia Skeeter Chief Complaint/Reason for Consult: SBO, leukocytosis  HPI:  63 year old female presents with abdominal pain over last several days. She states that 7 years ago (2007) she had "a twisted bowel". This did not need to have surgery and was successfully treated conservatively.  Since then she's been following with Dr. Henrene Pastor of gastroenterology. In September she's been having intermittent abdominal pain for 1-2 days (2-3x/month) with nausea and vomiting and then spontaneously resolves.  She's been referred to Dr. Excell Seltzer of surgery, and if her pain were to keep going she would need a possible diagnostic laparoscopy.    She usually feels the pain in the left lower quadrant with some radiation across the the lower abdomen. The last several days ('Sunday) been having severe pain, nausea, and vomiting more than it typically lasts. Has not had a bowel movement over a week, some flatus.  Normally has a BM every 3 days.  CSP done in January 2015 by Dr. Perry.  At this point pain is improving over today but she still very concerned.  Has had chills but no fevers. At this point she rates the pain as a 3 or 4/10 and is not wanting narcotic pain medicine. She is currently nauseous.  H/o abdominal hysterectomy.  ROS: All systems reviewed and otherwise negative except for as above  History reviewed. No pertinent family history.  Past Medical History  Diagnosis Date  . Arthritis     osteoarthritis  . PONV (postoperative nausea and vomiting)   . Subarachnoid hemorrhage     at age 44- has some weakness on right side( not really any residual)  . Small bowel obstruction 05/24/2005  . Hemorrhoids   . Aneurysm     Past Surgical History  Procedure Laterality Date  . Anterior cervical decomp/discectomy fusion    . Abdominal hysterectomy      1986  . Tonsillectomy      30'  years ago  . Ear cyst excision  10/25/2011    Procedure: CYST REMOVAL;   Surgeon: Wynonia Sours, MD;  Location: [facility];  Service: Orthopedics;  Laterality: Right;  Excision cyst, debridement distal interphalangeal right middle finger    Social History:  reports that she has never smoked. She has never used smokeless tobacco. She reports that she drinks alcohol. She reports that she does not use illicit drugs.  Allergies:  Allergies  Allergen Reactions  . Darvocet [Propoxyphene N-Acetaminophen] Nausea And Vomiting  . Percocet [Oxycodone-Acetaminophen] Nausea And Vomiting  . Vicodin [Hydrocodone-Acetaminophen] Nausea And Vomiting     (Not in a hospital admission)  Blood pressure 139/72, pulse 90, temperature 98.4 F (36.9 C), temperature source Oral, resp. rate 18, SpO2 99.00%. Physical Exam: General: pleasant, WD/WN white female who is laying in bed in NAD HEENT: head is normocephalic, atraumatic.  Sclera are noninjected.  PERRL.  Ears and nose without any masses or lesions.  Mouth is pink but dry Heart: regular, rate, and rhythm.  No obvious murmurs, gallops, or rubs noted.  Palpable pedal pulses bilaterally Lungs: CTAB, no wheezes, rhonchi, or rales noted.  Respiratory effort nonlabored Abd: soft, ND, tender in the lower abdomen >LLQ/suprapubic, +BS, no masses, hernias, or organomegaly MS: all 4 extremities are symmetrical with no cyanosis, clubbing, or edema. Skin: warm and quite dry with no masses, lesions, or rashes Psych: A&Ox3 with an appropriate affect.   Results for orders placed during the hospital encounter of 09/17/13 (from the  past 48 hour(s))  CBC WITH DIFFERENTIAL     Status: Abnormal   Collection Time    09/17/13 11:20 AM      Result Value Ref Range   WBC 14.4 (*) 4.0 - 10.5 K/uL   RBC 5.34 (*) 3.87 - 5.11 MIL/uL   Hemoglobin 15.5 (*) 12.0 - 15.0 g/dL   HCT 45.2  36.0 - 46.0 %   MCV 84.6  78.0 - 100.0 fL   MCH 29.0  26.0 - 34.0 pg   MCHC 34.3  30.0 - 36.0 g/dL   RDW 12.5  11.5 - 15.5 %   Platelets 343  150 - 400  K/uL   Neutrophils Relative % 86 (*) 43 - 77 %   Neutro Abs 12.4 (*) 1.7 - 7.7 K/uL   Lymphocytes Relative 5 (*) 12 - 46 %   Lymphs Abs 0.7  0.7 - 4.0 K/uL   Monocytes Relative 9  3 - 12 %   Monocytes Absolute 1.3 (*) 0.1 - 1.0 K/uL   Eosinophils Relative 0  0 - 5 %   Eosinophils Absolute 0.0  0.0 - 0.7 K/uL   Basophils Relative 0  0 - 1 %   Basophils Absolute 0.0  0.0 - 0.1 K/uL  COMPREHENSIVE METABOLIC PANEL     Status: Abnormal   Collection Time    09/17/13 11:20 AM      Result Value Ref Range   Sodium 134 (*) 137 - 147 mEq/L   Potassium 3.7  3.7 - 5.3 mEq/L   Chloride 89 (*) 96 - 112 mEq/L   CO2 33 (*) 19 - 32 mEq/L   Glucose, Bld 126 (*) 70 - 99 mg/dL   BUN 33 (*) 6 - 23 mg/dL   Creatinine, Ser 0.96  0.50 - 1.10 mg/dL   Calcium 10.3  8.4 - 10.5 mg/dL   Total Protein 7.5  6.0 - 8.3 g/dL   Albumin 4.5  3.5 - 5.2 g/dL   AST 13  0 - 37 U/L   ALT 12  0 - 35 U/L   Alkaline Phosphatase 54  39 - 117 U/L   Total Bilirubin 1.0  0.3 - 1.2 mg/dL   GFR calc non Af Amer 62 (*) >90 mL/min   GFR calc Af Amer 71 (*) >90 mL/min   Comment: (NOTE)     The eGFR has been calculated using the CKD EPI equation.     This calculation has not been validated in all clinical situations.     eGFR's persistently <90 mL/min signify possible Chronic Kidney     Disease.  LIPASE, BLOOD     Status: None   Collection Time    09/17/13 11:30 AM      Result Value Ref Range   Lipase 16  11 - 59 U/L  I-STAT CG4 LACTIC ACID, ED     Status: None   Collection Time    09/17/13 11:47 AM      Result Value Ref Range   Lactic Acid, Venous 1.65  0.5 - 2.2 mmol/L  URINALYSIS, ROUTINE W REFLEX MICROSCOPIC     Status: Abnormal   Collection Time    09/17/13  3:00 PM      Result Value Ref Range   Color, Urine YELLOW  YELLOW   APPearance CLEAR  CLEAR   Specific Gravity, Urine >1.046 (*) 1.005 - 1.030   pH 7.0  5.0 - 8.0   Glucose, UA NEGATIVE  NEGATIVE mg/dL   Hgb urine dipstick  TRACE (*) NEGATIVE   Bilirubin  Urine NEGATIVE  NEGATIVE   Ketones, ur 40 (*) NEGATIVE mg/dL   Protein, ur NEGATIVE  NEGATIVE mg/dL   Urobilinogen, UA 1.0  0.0 - 1.0 mg/dL   Nitrite NEGATIVE  NEGATIVE   Leukocytes, UA NEGATIVE  NEGATIVE  URINE MICROSCOPIC-ADD ON     Status: None   Collection Time    09/17/13  3:00 PM      Result Value Ref Range   Squamous Epithelial / LPF RARE  RARE   WBC, UA 0-2  <3 WBC/hpf   RBC / HPF 3-6  <3 RBC/hpf   Bacteria, UA RARE  RARE   Ct Abdomen Pelvis W Contrast  09/17/2013   CLINICAL DATA:  Intermittent abdominal pain. History of bowel obstructions. Nausea and vomiting.  EXAM: CT ABDOMEN AND PELVIS WITH CONTRAST  TECHNIQUE: Multidetector CT imaging of the abdomen and pelvis was performed using the standard protocol following bolus administration of intravenous contrast.  CONTRAST:  138m OMNIPAQUE IOHEXOL 300 MG/ML  SOLN  COMPARISON:  CT abdomen and pelvis 03/20/2013.  FINDINGS: The lung bases are clear without focal nodule, mass, or airspace disease. Heart size is normal.  Abdominal ascites are evident adjacent to the free edge of the liver. Liver and spleen are otherwise unremarkable. The stomach is mildly distended. The duodenum is distended. There is distention of proximal small bowel measuring up to 4.5 cm. This can be followed to a transition point within the central mesenteries edematous changes and nodes are present within the central mesentery is well. Additionally, there is some twisting of the mesenteric vessels. Additional areas of layering ascites are present.  There is no free air. The ascending colon and is filled with stool. The transverse and descending colon is collapsed. Urinary bladder is within normal limits.  The common bile duct and gallbladder are normal. The adrenal glands are normal bilaterally. The kidneys and ureters are within normal limits. No significant adenopathy is present. Minimal atherosclerotic calcifications are present in the aorta. There is no aneurysm.  Mild  degenerative changes are noted in the lower lumbar spine. No focal lytic or blastic lesions are present.  IMPRESSION: 1. Small bowel obstruction with transition point in the central mesenteries. This raises concern for an internal hernia and closed loop obstruction. 2. There is twisting of the mesentery which supports this diagnosis is well. 3. Moderate free fluid without evidence for free air. 4. Solid organs are unremarkable. 5. Atherosclerosis without aneurysm. These results were called by telephone at the time of interpretation on 09/17/2013 at 3:27 PM to Dr. SSherwood Gambler, who verbally acknowledged these results.   Electronically Signed   By: CLawrence SantiagoM.D.   On: 09/17/2013 15:28      Assessment/Plan SBO ?internal hernia/closed loop obstruction Mesenteric edema/inflammation Free abdominal fluid Leukocytosis Hiatal hernia Psoriatic arthritis H/o SAH, brain aneurysm 1996  Plan: 1.  Admit to [facility]  2.  NG tube decompression, NPO, bowel rest, IVF, pain control, antiemetics, antibiotics 3.  SCD's and lovenox for DVT proph 4.  Ambulate and IS 5.  Potentially plan for surgery tomorrow if not significantly improved.  Would attempt laparoscopic, but may need to convert to open, hopefully will not need bowel resection/ostomy 6.  The patient prefers getting surgery while here because how severe the episodes are becoming and more frequently   MCoralie Keens [facility] 09/17/2013, 3:43 PM Pager: [phone] Agree with above. She has recurrent "attack" since last fall that occur  several times a month.   She had a consultation with Dr. Excell Seltzer on 06/13/2013 about the options of medical vs surgical management of her recurrent attacks.  She is now in the [facility] and wants to pursue surgery.  I know her husband, Gershon Mussel, from basketball days.  Alphonsa Overall, MD, [facility] Pager: [phone] Office phone:  [phone]

## 2013-09-17 NOTE — ED Notes (Signed)
Bed: WA19 Expected date:  Expected time:  Means of arrival:  Comments: 

## 2013-09-17 NOTE — ED Notes (Signed)
Pt is aware that a urine sample is needed.  

## 2013-09-17 NOTE — ED Provider Notes (Signed)
CSN: [id number]     Arrival date & time 09/17/13  1028 History   First MD Initiated Contact with Patient 09/17/13 1110     Chief Complaint  Patient presents with  . Abdominal Pain     (Consider location/radiation/quality/duration/timing/severity/associated sxs/prior Treatment) HPI 63 year old female presents with abdominal pain over last several days. She states that 7 years ago she had "a twisted bowel". This did not need to have surgery. Since then she's been following with Dr. Marina Goodell of gastroenterology. Over the last 5 months she's been having intermittent abdominal pain for one to 2 days with nausea and vomiting and then spontaneously resolves. She's been referred to Dr. Johna Sheriff of surgery, and if her pain were to keep going she would need possible surgery. She usually feels the pain in the left lower quadrant. The last several days been having severe pain, nausea, and vomiting more than it typically lasts. Has not had a bowel movement over a week. At this point pain is improving over today but she still very concerned. Has had chills but no fevers. At this point she rates the pain as a 3 or 4/10 and is not wanting pain medicine. She is currently nauseous.  Past Medical History  Diagnosis Date  . Arthritis     osteoarthritis  . PONV (postoperative nausea and vomiting)   . Subarachnoid hemorrhage     at age 26- has some weakness on right side( not really any residual)  . Small bowel obstruction 05/24/2005  . Hemorrhoids   . Aneurysm    Past Surgical History  Procedure Laterality Date  . Anterior cervical decomp/discectomy fusion    . Abdominal hysterectomy      1986  . Tonsillectomy      30 years ago  . Ear cyst excision  10/25/2011    Procedure: CYST REMOVAL;  Surgeon: Nicki Reaper, MD;  Location: [facility];  Service: Orthopedics;  Laterality: Right;  Excision cyst, debridement distal interphalangeal right middle finger   History reviewed. No pertinent family  history. History  Substance Use Topics  . Smoking status: Never Smoker   . Smokeless tobacco: Never Used  . Alcohol Use: Yes     Comment: glass of wine once a month   OB History   Grav Para Term Preterm Abortions TAB SAB Ect Mult Living                 Review of Systems  Constitutional: Positive for chills. Negative for fever.  Gastrointestinal: Positive for nausea, vomiting, abdominal pain and constipation. Negative for diarrhea.  Genitourinary: Negative for dysuria.  Musculoskeletal: Positive for back pain.  All other systems reviewed and are negative.     Allergies  Darvocet; Percocet; and Vicodin  Home Medications   Prior to Admission medications   Medication Sig Start Date End Date Taking? Authorizing Provider  meloxicam (MOBIC) 15 MG tablet Take 15 mg by mouth daily.   Yes Historical Provider, MD  Multiple Vitamin (MULTIVITAMIN) tablet Take 1 tablet by mouth daily.   Yes Historical Provider, MD  omeprazole (PRILOSEC OTC) 20 MG tablet Take 20 mg by mouth daily.   Yes Historical Provider, MD   BP 107/76  Pulse 104  Temp(Src) 98.2 F (36.8 C) (Oral)  Resp 16  SpO2 97% Physical Exam  Nursing note and vitals reviewed. Constitutional: She is oriented to person, place, and time. She appears well-developed and well-nourished. No distress.  HENT:  Head: Normocephalic and atraumatic.  Right Ear: External ear  normal.  Left Ear: External ear normal.  Nose: Nose normal.  Eyes: Right eye exhibits no discharge. Left eye exhibits no discharge.  Cardiovascular: Normal rate, regular rhythm and normal heart sounds.   Pulmonary/Chest: Effort normal and breath sounds normal.  Abdominal: Soft. There is tenderness (mild generalized tenderness, worst in LLQ) in the left lower quadrant.  Neurological: She is alert and oriented to person, place, and time.  Skin: Skin is warm and dry.    ED Course  Procedures (including critical care time) Labs Review Labs Reviewed  CBC WITH  DIFFERENTIAL - Abnormal; Notable for the following:    WBC 14.4 (*)    RBC 5.34 (*)    Hemoglobin 15.5 (*)    Neutrophils Relative % 86 (*)    Neutro Abs 12.4 (*)    Lymphocytes Relative 5 (*)    Monocytes Absolute 1.3 (*)    All other components within normal limits  COMPREHENSIVE METABOLIC PANEL - Abnormal; Notable for the following:    Sodium 134 (*)    Chloride 89 (*)    CO2 33 (*)    Glucose, Bld 126 (*)    BUN 33 (*)    GFR calc non Af Amer 62 (*)    GFR calc Af Amer 71 (*)    All other components within normal limits  URINALYSIS, ROUTINE W REFLEX MICROSCOPIC - Abnormal; Notable for the following:    Specific Gravity, Urine >1.046 (*)    Hgb urine dipstick TRACE (*)    Ketones, ur 40 (*)    All other components within normal limits  LIPASE, BLOOD  URINE MICROSCOPIC-ADD ON  I-STAT CG4 LACTIC ACID, ED    Imaging Review Ct Abdomen Pelvis W Contrast  09/17/2013   CLINICAL DATA:  Intermittent abdominal pain. History of bowel obstructions. Nausea and vomiting.  EXAM: CT ABDOMEN AND PELVIS WITH CONTRAST  TECHNIQUE: Multidetector CT imaging of the abdomen and pelvis was performed using the standard protocol following bolus administration of intravenous contrast.  CONTRAST:  OMNIPAQUE IOHEXOL 300 MG/ML  SOLN  COMPARISON:  CT abdomen and pelvis 03/20/2013.  FINDINGS: The lung bases are clear without focal nodule, mass, or airspace disease. Heart size is normal.  Abdominal ascites are evident adjacent to the free edge of the liver. Liver and spleen are otherwise unremarkable. The stomach is mildly distended. The duodenum is distended. There is distention of proximal small bowel measuring up to 4.5 cm. This can be followed to a transition point within the central mesenteries edematous changes and nodes are present within the central mesentery is well. Additionally, there is some twisting of the mesenteric vessels. Additional areas of layering ascites are present.  There is no free air.  The ascending colon and is filled with stool. The transverse and descending colon is collapsed. Urinary bladder is within normal limits.  The common bile duct and gallbladder are normal. The adrenal glands are normal bilaterally. The kidneys and ureters are within normal limits. No significant adenopathy is present. Minimal atherosclerotic calcifications are present in the aorta. There is no aneurysm.  Mild degenerative changes are noted in the lower lumbar spine. No focal lytic or blastic lesions are present.  IMPRESSION: 1. Small bowel obstruction with transition point in the central mesenteries. This raises concern for an internal hernia and closed loop obstruction. 2. There is twisting of the mesentery which supports this diagnosis is well. 3. Moderate free fluid without evidence for free air. 4. Solid organs are unremarkable. 5. Atherosclerosis without  aneurysm. These results were called by telephone at the time of interpretation on 09/17/2013 at 3:27 PM to Dr. Pricilla Loveless , who verbally acknowledged these results.   Electronically Signed   By: Gennette Pac M.D.   On: 09/17/2013 15:28     EKG Interpretation None      MDM   Final diagnoses:  Bowel obstruction    Patient's CT scan shows evidence of a closed loop obstruction. She only wanted non-narcotics and was given tylenol with moderate relief. Otherwise kept NPO and given IV fluids and zofran. Discussed her results with patient and consulted surgery, who will evaluated the patient in ED. They will likely admit as she does not have many comorbidities. If not will need hospitalist admission. Will hold on NG until surgery evaluation per them.    Audree Camel, MD 09/17/13 [phone]

## 2013-09-17 NOTE — ED Notes (Signed)
Mat RN at bedside to attempt NG tube insertion.

## 2013-09-17 NOTE — ED Notes (Signed)
Per pt, has had abdominal pain off and on for a couple of months.  Sees Dr. Marina Goodell.  Pt has hx of bowel obstructions.  Pt told if symptoms ever occur, come back to ED.  Pt has not notified Dr. Marina Goodell at this time.  Pt having nausea vomiting and abdominal pain since Sunday.  Last good BM last Thursday.

## 2013-09-18 ENCOUNTER — Encounter (HOSPITAL_COMMUNITY): Payer: BC Managed Care – PPO | Admitting: Anesthesiology

## 2013-09-18 ENCOUNTER — Encounter (HOSPITAL_COMMUNITY)
Admission: EM | Disposition: A | Discharge status: Home / Self Care | Payer: Self-pay | Source: Home / Self Care | Attending: Emergency Medicine

## 2013-09-18 ENCOUNTER — Observation Stay (HOSPITAL_COMMUNITY): Payer: BC Managed Care – PPO | Admitting: Anesthesiology

## 2013-09-18 DIAGNOSIS — K565 Intestinal adhesions [bands], unspecified as to partial versus complete obstruction: Secondary | ICD-10-CM

## 2013-09-18 HISTORY — PX: LAPAROSCOPIC SMALL BOWEL RESECTION: SHX5929

## 2013-09-18 LAB — BASIC METABOLIC PANEL
BUN: 21 mg/dL (ref 6–23)
CALCIUM: 8.6 mg/dL (ref 8.4–10.5)
CO2: 29 mEq/L (ref 19–32)
CREATININE: 0.74 mg/dL (ref 0.50–1.10)
Chloride: 99 mEq/L (ref 96–112)
GFR calc Af Amer: >90 mL/min (ref >90)
GFR, EST NON AFRICAN AMERICAN: 89 mL/min — AB (ref >90)
GLUCOSE: 90 mg/dL (ref 70–99)
Potassium: 4 mEq/L (ref 3.7–5.3)
Sodium: 136 mEq/L — ABNORMAL LOW (ref 137–147)

## 2013-09-18 LAB — CBC
HCT: 37.9 % (ref 36.0–46.0)
Hemoglobin: 13.1 g/dL (ref 12.0–15.0)
MCH: 29.3 pg (ref 26.0–34.0)
MCHC: 34.6 g/dL (ref 30.0–36.0)
MCV: 84.8 fL (ref 78.0–100.0)
PLATELETS: 271 10*3/uL (ref 150–400)
RBC: 4.47 MIL/uL (ref 3.87–5.11)
RDW: 12.4 % (ref 11.5–15.5)
WBC: 10.4 10*3/uL (ref 4.0–10.5)

## 2013-09-18 LAB — SURGICAL PCR SCREEN
MRSA, PCR: NEGATIVE
Staphylococcus aureus: NEGATIVE

## 2013-09-18 SURGERY — EXCISION, SMALL INTESTINE, LAPAROSCOPIC
Anesthesia: General | Site: Abdomen

## 2013-09-18 MED ORDER — GLYCOPYRROLATE 0.2 MG/ML IJ SOLN
INTRAMUSCULAR | Status: DC | PRN
  Administered 2013-09-18: 0.4 mg via INTRAVENOUS

## 2013-09-18 MED ORDER — DEXTROSE 5 % IV SOLN
1.0000 g | Freq: Once | INTRAVENOUS | Status: AC
  Administered 2013-09-18: 1 g via INTRAVENOUS
  Filled 2013-09-18: qty 1

## 2013-09-18 MED ORDER — BUPIVACAINE HCL (PF) 0.25 % IJ SOLN
INTRAMUSCULAR | Status: DC | PRN
  Administered 2013-09-18: 16 mL

## 2013-09-18 MED ORDER — MIDAZOLAM HCL 5 MG/5ML IJ SOLN
INTRAMUSCULAR | Status: DC | PRN
  Administered 2013-09-18: 2 mg via INTRAVENOUS

## 2013-09-18 MED ORDER — PROPOFOL 10 MG/ML IV BOLUS
INTRAVENOUS | Status: DC | PRN
  Administered 2013-09-18: 150 mg via INTRAVENOUS

## 2013-09-18 MED ORDER — LACTATED RINGERS IV SOLN
INTRAVENOUS | Status: DC
  Administered 2013-09-18: 1000 mL via INTRAVENOUS

## 2013-09-18 MED ORDER — DEXAMETHASONE SODIUM PHOSPHATE 10 MG/ML IJ SOLN
INTRAMUSCULAR | Status: AC
Start: 2013-09-18 — End: 2013-09-18
  Filled 2013-09-18: qty 1

## 2013-09-18 MED ORDER — POTASSIUM CHLORIDE IN NACL 20-0.9 MEQ/L-% IV SOLN
INTRAVENOUS | Status: DC
  Administered 2013-09-18 – 2013-09-19 (×3): via INTRAVENOUS
  Filled 2013-09-18 (×3): qty 1000

## 2013-09-18 MED ORDER — BUPIVACAINE HCL (PF) 0.25 % IJ SOLN
INTRAMUSCULAR | Status: AC
  Filled 2013-09-18: qty 30

## 2013-09-18 MED ORDER — ONDANSETRON HCL 4 MG/2ML IJ SOLN
INTRAMUSCULAR | Status: DC | PRN
Start: 2013-09-18 — End: 2013-09-18
  Administered 2013-09-18: 4 mg via INTRAVENOUS

## 2013-09-18 MED ORDER — LIDOCAINE HCL (CARDIAC) 20 MG/ML IV SOLN
INTRAVENOUS | Status: AC
  Filled 2013-09-18: qty 5

## 2013-09-18 MED ORDER — NEOSTIGMINE METHYLSULFATE 10 MG/10ML IV SOLN
INTRAVENOUS | Status: AC
  Filled 2013-09-18: qty 1

## 2013-09-18 MED ORDER — GLYCOPYRROLATE 0.2 MG/ML IJ SOLN
INTRAMUSCULAR | Status: AC
  Filled 2013-09-18: qty 2

## 2013-09-18 MED ORDER — PROPOFOL 10 MG/ML IV BOLUS
INTRAVENOUS | Status: AC
  Filled 2013-09-18: qty 20

## 2013-09-18 MED ORDER — ONDANSETRON HCL 4 MG/2ML IJ SOLN
INTRAMUSCULAR | Status: AC
  Filled 2013-09-18: qty 2

## 2013-09-18 MED ORDER — DEXAMETHASONE SODIUM PHOSPHATE 10 MG/ML IJ SOLN
INTRAMUSCULAR | Status: DC | PRN
  Administered 2013-09-18: 10 mg via INTRAVENOUS

## 2013-09-18 MED ORDER — FENTANYL CITRATE 0.05 MG/ML IJ SOLN
INTRAMUSCULAR | Status: AC
  Filled 2013-09-18: qty 5

## 2013-09-18 MED ORDER — ROCURONIUM BROMIDE 100 MG/10ML IV SOLN
INTRAVENOUS | Status: DC | PRN
  Administered 2013-09-18: 30 mg via INTRAVENOUS

## 2013-09-18 MED ORDER — KETOROLAC TROMETHAMINE 30 MG/ML IJ SOLN: 15.0000 mg | Freq: Once | INTRAMUSCULAR | Status: DC | PRN

## 2013-09-18 MED ORDER — LIDOCAINE HCL (CARDIAC) 20 MG/ML IV SOLN
INTRAVENOUS | Status: DC | PRN
  Administered 2013-09-18: 100 mg via INTRAVENOUS

## 2013-09-18 MED ORDER — HEPARIN SODIUM (PORCINE) 5000 UNIT/ML IJ SOLN
5000.0000 [IU] | Freq: Three times a day (TID) | INTRAMUSCULAR | Status: DC
Start: 2013-09-18 — End: 2013-09-20
  Administered 2013-09-18 – 2013-09-19 (×4): 5000 [IU] via SUBCUTANEOUS
  Filled 2013-09-18 (×8): qty 1

## 2013-09-18 MED ORDER — PROMETHAZINE HCL 25 MG/ML IJ SOLN: 6.2500 mg | INTRAMUSCULAR | Status: DC | PRN

## 2013-09-18 MED ORDER — PHENYLEPHRINE HCL 10 MG/ML IJ SOLN
INTRAMUSCULAR | Status: DC | PRN
  Administered 2013-09-18: 80 ug via INTRAVENOUS

## 2013-09-18 MED ORDER — HYDROMORPHONE HCL PF 1 MG/ML IJ SOLN: 0.2500 mg | INTRAMUSCULAR | Status: DC | PRN

## 2013-09-18 MED ORDER — PHENYLEPHRINE 40 MCG/ML (10ML) SYRINGE FOR IV PUSH (FOR BLOOD PRESSURE SUPPORT)
PREFILLED_SYRINGE | INTRAVENOUS | Status: AC
  Filled 2013-09-18: qty 10

## 2013-09-18 MED ORDER — NEOSTIGMINE METHYLSULFATE 10 MG/10ML IV SOLN
INTRAVENOUS | Status: DC | PRN
  Administered 2013-09-18: 3 mg via INTRAVENOUS

## 2013-09-18 MED ORDER — ROCURONIUM BROMIDE 100 MG/10ML IV SOLN
INTRAVENOUS | Status: AC
  Filled 2013-09-18: qty 1

## 2013-09-18 MED ORDER — FENTANYL CITRATE 0.05 MG/ML IJ SOLN
INTRAMUSCULAR | Status: DC | PRN
  Administered 2013-09-18 (×4): 50 ug via INTRAVENOUS

## 2013-09-18 MED ORDER — MIDAZOLAM HCL 2 MG/2ML IJ SOLN
INTRAMUSCULAR | Status: AC
  Filled 2013-09-18: qty 2

## 2013-09-18 MED ORDER — LACTATED RINGERS IV SOLN
INTRAVENOUS | Status: DC | PRN
  Administered 2013-09-18: 1000 mL

## 2013-09-18 MED ORDER — SUCCINYLCHOLINE CHLORIDE 20 MG/ML IJ SOLN
INTRAMUSCULAR | Status: DC | PRN
  Administered 2013-09-18: 100 mg via INTRAVENOUS

## 2013-09-18 SURGICAL SUPPLY — 53 items
BENZOIN TINCTURE PRP APPL 2/3 (GAUZE/BANDAGES/DRESSINGS) IMPLANT
BLADE EXTENDED COATED 6.5IN (ELECTRODE) ×3 IMPLANT
BLADE HEX COATED 2.75 (ELECTRODE) ×3 IMPLANT
BLADE SURG SZ10 CARB STEEL (BLADE) ×6 IMPLANT
CANISTER SUCTION 2500CC (MISCELLANEOUS) IMPLANT
CLOSURE WOUND 1/2 X4 (GAUZE/BANDAGES/DRESSINGS)
COUNTER NEEDLE 20 DBL MAG RED (NEEDLE) ×3 IMPLANT
COVER MAYO STAND STRL (DRAPES) ×3 IMPLANT
DECANTER SPIKE VIAL GLASS SM (MISCELLANEOUS) IMPLANT
DERMABOND ADVANCED (GAUZE/BANDAGES/DRESSINGS) ×2
DERMABOND ADVANCED .7 DNX12 (GAUZE/BANDAGES/DRESSINGS) ×1 IMPLANT
DRAPE LAPAROSCOPIC ABDOMINAL (DRAPES) ×3 IMPLANT
DRAPE UTILITY XL STRL (DRAPES) ×3 IMPLANT
DRAPE WARM FLUID 44X44 (DRAPE) ×3 IMPLANT
ELECT REM PT RETURN 9FT ADLT (ELECTROSURGICAL) ×3
ELECTRODE REM PT RTRN 9FT ADLT (ELECTROSURGICAL) ×1 IMPLANT
GLOVE BIOGEL PI IND STRL 7.0 (GLOVE) ×1 IMPLANT
GLOVE BIOGEL PI INDICATOR 7.0 (GLOVE) ×2
GLOVE SURG SIGNA 7.5 PF LTX (GLOVE) ×6 IMPLANT
GOWN SPEC L4 XLG W/TWL (GOWN DISPOSABLE) ×3 IMPLANT
GOWN STRL REUS W/ TWL XL LVL3 (GOWN DISPOSABLE) ×3 IMPLANT
GOWN STRL REUS W/TWL LRG LVL3 (GOWN DISPOSABLE) ×3 IMPLANT
GOWN STRL REUS W/TWL XL LVL3 (GOWN DISPOSABLE) ×6
KIT BASIN OR (CUSTOM PROCEDURE TRAY) ×3 IMPLANT
PENCIL BUTTON HOLSTER BLD 10FT (ELECTRODE) ×6 IMPLANT
SET IRRIG TUBING LAPAROSCOPIC (IRRIGATION / IRRIGATOR) IMPLANT
SHEARS HARMONIC ACE PLUS 36CM (ENDOMECHANICALS) ×3 IMPLANT
SLEEVE ADV FIXATION 5X100MM (TROCAR) ×6 IMPLANT
SOLUTION ANTI FOG 6CC (MISCELLANEOUS) ×3 IMPLANT
SPONGE LAP 18X18 X RAY DECT (DISPOSABLE) ×6 IMPLANT
STRIP CLOSURE SKIN 1/2X4 (GAUZE/BANDAGES/DRESSINGS) IMPLANT
SUCTION POOLE TIP (SUCTIONS) ×3 IMPLANT
SUT MON AB 5-0 PS2 18 (SUTURE) ×3 IMPLANT
SUT SILK 2 0 SH CR/8 (SUTURE) ×9 IMPLANT
SUT SILK 3 0 (SUTURE) ×2
SUT SILK 3 0 SH CR/8 (SUTURE) ×3 IMPLANT
SUT SILK 3-0 18XBRD TIE 12 (SUTURE) ×1 IMPLANT
SUT VIC AB 4-0 PS2 27 (SUTURE) IMPLANT
SUT VICRYL 0 UR6 27IN ABS (SUTURE) ×3 IMPLANT
SYR BULB IRRIGATION 50ML (SYRINGE) ×3 IMPLANT
TOWEL OR 17X26 10 PK STRL BLUE (TOWEL DISPOSABLE) ×6 IMPLANT
TOWEL OR NON WOVEN STRL DISP B (DISPOSABLE) ×3 IMPLANT
TRAY FOLEY CATH 14FRSI W/METER (CATHETERS) IMPLANT
TRAY LAP CHOLE (CUSTOM PROCEDURE TRAY) ×3 IMPLANT
TROCAR ADV FIXATION 5X100MM (TROCAR) ×3 IMPLANT
TROCAR BLADELESS OPT 5 75 (ENDOMECHANICALS) IMPLANT
TROCAR HASSON 5MM (TROCAR) ×3 IMPLANT
TROCAR XCEL BLUNT TIP 100MML (ENDOMECHANICALS) IMPLANT
TROCAR XCEL NON-BLD 11X100MML (ENDOMECHANICALS) IMPLANT
TROCAR XCEL UNIV SLVE 11M 100M (ENDOMECHANICALS) IMPLANT
TUBING FILTER THERMOFLATOR (ELECTROSURGICAL) ×3 IMPLANT
TUBING INSUFFLATION 10FT LAP (TUBING) ×3 IMPLANT
WATER STERILE IRR 1500ML POUR (IV SOLUTION) IMPLANT

## 2013-09-18 NOTE — Anesthesia Postprocedure Evaluation (Signed)
  Anesthesia Post-op Note  Patient: Kylie Peterson  Procedure(s) Performed: Procedure(s) (LRB): LAPAROSCOPIC LYSIS OF ADHESIONS (N/A)  Patient Location: PACU  Anesthesia Type: General  Level of Consciousness: awake and alert   Airway and Oxygen Therapy: Patient Spontanous Breathing  Post-op Pain: mild  Post-op Assessment: Post-op Vital signs reviewed, Patient's Cardiovascular Status Stable, Respiratory Function Stable, Patent Airway and No signs of Nausea or vomiting  Last Vitals:  Filed Vitals:   09/18/13 1445  BP: 130/74  Pulse: 72  Temp: 36.7 C  Resp: 16    Post-op Vital Signs: stable   Complications: No apparent anesthesia complications

## 2013-09-18 NOTE — Progress Notes (Signed)
General Surgery Note  LOS: 1 day  POD -     Assessment/Plan: 1.  Recurrent small bowel obstruction  Better today, but wants to go ahead with surgery.  I explained the surgery and the risks.  I think that it is unlikely that she has ischemic bowel.  Will try to do the surgery laparoscopically, but there is about a 50% chance that the surgery will be open.  There is also a chance of requiring a small bowel resection, depending on the findings.  The risks of surgery include, but are not limited to, bleeding, infection, bowel resection, and nerve injury.  Husband coming later this AM.  2.  History of subarachnoid hemorrhage in 1996 - no sequelae 3.  Psoriatic arthritis   Active Problems:   SBO (small bowel obstruction)  Subjective:  Somewhat better this AM.  Still has abdominal pain. Objective:   Filed Vitals:   09/18/13 0519  BP: 115/79  Pulse: 88  Temp: 99.2 F (37.3 C)  Resp: 16     Intake/Output from previous day:  06/03 0701 - 06/04 0700 In: 1782.5 [I.V.:1782.5] Out: 1200 [Urine:800; Emesis/NG output:400]  Intake/Output this shift:      Physical Exam:   General: WN WF who is alert and oriented.    HEENT: Normal. Pupils equal. .   Lungs: Clear.   Abdomen: Soft with some mild tenderness.  1200 cc recorded in NGT since yesterday.   Lab Results:    Recent Labs  09/17/13 1120 09/18/13 0517  WBC 14.4* 10.4  HGB 15.5* 13.1  HCT 45.2 37.9  PLT 343 271    BMET   Recent Labs  09/17/13 1120 09/18/13 0517  NA 134* 136*  K 3.7 4.0  CL 89* 99  CO2 33* 29  GLUCOSE 126* 90  BUN 33* 21  CREATININE 0.96 0.74  CALCIUM 10.3 8.6    PT/INR  No results found for this basename: LABPROT, INR,  in the last 72 hours  ABG  No results found for this basename: PHART, PCO2, PO2, HCO3,  in the last 72 hours   Studies/Results:  Ct Abdomen Pelvis W Contrast  09/17/2013   CLINICAL DATA:  Intermittent abdominal pain. History of bowel obstructions. Nausea and vomiting.  EXAM: CT  ABDOMEN AND PELVIS WITH CONTRAST  TECHNIQUE: Multidetector CT imaging of the abdomen and pelvis was performed using the standard protocol following bolus administration of intravenous contrast.  CONTRAST:  OMNIPAQUE IOHEXOL 300 MG/ML  SOLN  COMPARISON:  CT abdomen and pelvis 03/20/2013.  FINDINGS: The lung bases are clear without focal nodule, mass, or airspace disease. Heart size is normal.  Abdominal ascites are evident adjacent to the free edge of the liver. Liver and spleen are otherwise unremarkable. The stomach is mildly distended. The duodenum is distended. There is distention of proximal small bowel measuring up to 4.5 cm. This can be followed to a transition point within the central mesenteries edematous changes and nodes are present within the central mesentery is well. Additionally, there is some twisting of the mesenteric vessels. Additional areas of layering ascites are present.  There is no free air. The ascending colon and is filled with stool. The transverse and descending colon is collapsed. Urinary bladder is within normal limits.  The common bile duct and gallbladder are normal. The adrenal glands are normal bilaterally. The kidneys and ureters are within normal limits. No significant adenopathy is present. Minimal atherosclerotic calcifications are present in the aorta. There is no aneurysm.  Mild  degenerative changes are noted in the lower lumbar spine. No focal lytic or blastic lesions are present.  IMPRESSION: 1. Small bowel obstruction with transition point in the central mesenteries. This raises concern for an internal hernia and closed loop obstruction. 2. There is twisting of the mesentery which supports this diagnosis is well. 3. Moderate free fluid without evidence for free air. 4. Solid organs are unremarkable. 5. Atherosclerosis without aneurysm. These results were called by telephone at the time of interpretation on 09/17/2013 at 3:27 PM to Dr. Pricilla Loveless , who verbally  acknowledged these results.   Electronically Signed   By: Gennette Pac M.D.   On: 09/17/2013 15:28     Anti-infectives:   Anti-infectives   Start     Dose/Rate Route Frequency Ordered Stop   09/18/13 0600  cefoTEtan (CEFOTAN) 2 g in dextrose 5 % 50 mL IVPB    Comments:  Pharmacy may adjust dose strength for optimal dosing.   Send with patient on call to the OR.  Anesthesia to complete antibiotic administration <10min prior to incision per [facility].   2 g 100 mL/hr over 30 Minutes Intravenous On call to O.R. 09/17/13 1620 09/18/13 0545      Ovidio Kin, MD, FACS Pager: [phone] [facility] Office: [phone] 09/18/2013

## 2013-09-18 NOTE — Anesthesia Preprocedure Evaluation (Signed)
Anesthesia Evaluation  Patient identified by MRN, date of birth, ID band Patient awake    Reviewed: Allergy & Precautions, H&P , NPO status , Patient's Chart, lab work & pertinent test results  Airway Mallampati: II TM Distance: >3 FB Neck ROM: Full    Dental no notable dental hx.    Pulmonary neg pulmonary ROS,  breath sounds clear to auscultation  Pulmonary exam normal       Cardiovascular negative cardio ROS  Rhythm:Regular Rate:Normal     Neuro/Psych  Subarachnoid hemorrhage 1996 at age 63- has some weakness on right side( not really any residual  negative neurological ROS  negative psych ROS   GI/Hepatic negative GI ROS, Neg liver ROS,   Endo/Other  negative endocrine ROS  Renal/GU negative Renal ROS  negative genitourinary   Musculoskeletal negative musculoskeletal ROS (+)   Abdominal   Peds negative pediatric ROS (+)  Hematology negative hematology ROS (+)   Anesthesia Other Findings   Reproductive/Obstetrics negative OB ROS                           Anesthesia Physical Anesthesia Plan  ASA: II  Anesthesia Plan: General   Post-op Pain Management:    Induction: Intravenous, Rapid sequence and Cricoid pressure planned  Airway Management Planned: Oral ETT  Additional Equipment:   Intra-op Plan:   Post-operative Plan: Extubation in OR  Informed Consent: I have reviewed the patients History and Physical, chart, labs and discussed the procedure including the risks, benefits and alternatives for the proposed anesthesia with the patient or authorized representative who has indicated his/her understanding and acceptance.   Dental advisory given  Plan Discussed with: CRNA and Surgeon  Anesthesia Plan Comments:         Anesthesia Quick Evaluation

## 2013-09-18 NOTE — Transfer of Care (Signed)
Immediate Anesthesia Transfer of Care Note  Patient: Kylie Peterson  Procedure(s) Performed: Procedure(s): LAPAROSCOPIC LYSIS OF ADHESIONS (N/A)  Patient Location: PACU  Anesthesia Type:General  Level of Consciousness: sedated  Airway & Oxygen Therapy: Patient Spontanous Breathing and Patient connected to face mask oxygen  Post-op Assessment: Report given to PACU RN and Post -op Vital signs reviewed and stable  Post vital signs: Reviewed and stable  Complications: No apparent anesthesia complications

## 2013-09-19 ENCOUNTER — Encounter (HOSPITAL_COMMUNITY): Payer: Self-pay | Admitting: Surgery

## 2013-09-19 MED ORDER — POLYETHYLENE GLYCOL 3350 17 GM/SCOOP PO POWD: 8.5000 g | Freq: Two times a day (BID) | ORAL | Status: AC | PRN

## 2013-09-19 MED ORDER — ONDANSETRON HCL 4 MG PO TABS: 4.0000 mg | ORAL_TABLET | Freq: Three times a day (TID) | ORAL | Status: AC | PRN

## 2013-09-19 MED ORDER — TRAMADOL HCL 50 MG PO TABS
50.0000 mg | ORAL_TABLET | Freq: Four times a day (QID) | ORAL | Status: DC | PRN
  Administered 2013-09-19 – 2013-09-20 (×3): 50 mg via ORAL
  Filled 2013-09-19 (×3): qty 1

## 2013-09-19 MED ORDER — TRAMADOL HCL 50 MG PO TABS: 50.0000 mg | ORAL_TABLET | Freq: Four times a day (QID) | ORAL | Status: DC | PRN

## 2013-09-19 MED ORDER — DOCUSATE SODIUM 250 MG PO CAPS: 250.0000 mg | ORAL_CAPSULE | Freq: Two times a day (BID) | ORAL | Status: AC | PRN

## 2013-09-19 NOTE — Discharge Summary (Signed)
[facility] Discharge Summary   Patient ID: Kylie Peterson MRN: [id number] DOB/AGE: [birth year] 63 y.o.  Admit date: 09/17/2013 Discharge date: 09/19/2013  Admitting Diagnosis: SBO Abdominal pain Nausea/vomiting  Discharge Diagnosis Patient Active Problem List   Diagnosis Date Noted  . SBO (small bowel obstruction) 09/17/2013    Consultants None  Imaging: Ct Abdomen Pelvis W Contrast  09/17/2013   CLINICAL DATA:  Intermittent abdominal pain. History of bowel obstructions. Nausea and vomiting.  EXAM: CT ABDOMEN AND PELVIS WITH CONTRAST  TECHNIQUE: Multidetector CT imaging of the abdomen and pelvis was performed using the standard protocol following bolus administration of intravenous contrast.  CONTRAST:  OMNIPAQUE IOHEXOL 300 MG/ML  SOLN  COMPARISON:  CT abdomen and pelvis 03/20/2013.  FINDINGS: The lung bases are clear without focal nodule, mass, or airspace disease. Heart size is normal.  Abdominal ascites are evident adjacent to the free edge of the liver. Liver and spleen are otherwise unremarkable. The stomach is mildly distended. The duodenum is distended. There is distention of proximal small bowel measuring up to 4.5 cm. This can be followed to a transition point within the central mesenteries edematous changes and nodes are present within the central mesentery is well. Additionally, there is some twisting of the mesenteric vessels. Additional areas of layering ascites are present.  There is no free air. The ascending colon and is filled with stool. The transverse and descending colon is collapsed. Urinary bladder is within normal limits.  The common bile duct and gallbladder are normal. The adrenal glands are normal bilaterally. The kidneys and ureters are within normal limits. No significant adenopathy is present. Minimal atherosclerotic calcifications are present in the aorta. There is no aneurysm.  Mild degenerative changes are noted in the lower lumbar spine. No  focal lytic or blastic lesions are present.  IMPRESSION: 1. Small bowel obstruction with transition point in the central mesenteries. This raises concern for an internal hernia and closed loop obstruction. 2. There is twisting of the mesentery which supports this diagnosis is well. 3. Moderate free fluid without evidence for free air. 4. Solid organs are unremarkable. 5. Atherosclerosis without aneurysm. These results were called by telephone at the time of interpretation on 09/17/2013 at 3:27 PM to Dr. Pricilla Loveless , who verbally acknowledged these results.   Electronically Signed   By: Gennette Pac M.D.   On: 09/17/2013 15:28    Procedures Dr. Ezzard Standing (09/18/13) - Laparoscopic enterolysis of adhesions with removal of chronic granulation point.   Hospital Course:  63 year old female presents with abdominal pain over last several days. She states that 7 years ago (2007) she had "a twisted bowel". This did not need to have surgery and was successfully treated conservatively. Since then she's been following with Dr. Marina Goodell of gastroenterology. In September she's been having intermittent abdominal pain for 1-2 days (2-3x/month) with nausea and vomiting and then spontaneously resolves. She's been referred to Dr. Johna Sheriff of surgery, and if her pain were to keep going she would need a possible diagnostic laparoscopy.   She usually feels the pain in the left lower quadrant with some radiation across the the lower abdomen. The last several days (Sunday) been having severe pain, nausea, and vomiting more than it typically lasts. Has not had a bowel movement over a week, some flatus. Normally has a BM every 3 days. CSP done in January 2015 by Dr. Marina Goodell. At this point pain is improving over today but she still very concerned. Has had chills but  no fevers. At this point she rates the pain as a 3 or 4/10 and is not wanting narcotic pain medicine. She is currently nauseous. H/o abdominal hysterectomy.  Workup showed -  CT SBO with possible internal hernia vs closed loop obstruction.  Patient was admitted and underwent procedure listed above on HD #1.  Tolerated procedure well and was transferred to the floor.  Diet was advanced as tolerated.  She had quick improvement.  On POD #1, the patient was voiding well, tolerating diet, ambulating well, pain well controlled, vital signs stable, incisions c/d/i and felt stable for discharge home.  Patient will follow up in our office in 2-3 weeks and knows to call with questions or concerns.      Medication List         docusate sodium 250 MG capsule  Commonly known as:  COLACE  Take 1 capsule (250 mg total) by mouth 2 (two) times daily as needed for constipation.     meloxicam 15 MG tablet  Commonly known as:  MOBIC  Take 15 mg by mouth daily.     multivitamin tablet  Take 1 tablet by mouth daily.     omeprazole 20 MG tablet  Commonly known as:  PRILOSEC OTC  Take 20 mg by mouth daily.     ondansetron 4 MG tablet  Commonly known as:  ZOFRAN  Take 1 tablet (4 mg total) by mouth every 8 (eight) hours as needed for nausea or vomiting.     polyethylene glycol powder powder  Commonly known as:  MIRALAX  Take 8.5-34 g by mouth 2 (two) times daily as needed for mild constipation, moderate constipation or severe constipation. To correct constipation.  Adjust dose over 1-2 months.  Goal = ~1 bowel movement / day     traMADol 50 MG tablet  Commonly known as:  ULTRAM  Take 1-2 tablets (50-100 mg total) by mouth every 6 (six) hours as needed for moderate pain or severe pain.             Follow-up Information   Follow up with [facility], MD. Schedule an appointment as soon as possible for a visit in 2 weeks. (For post-operation check in 2-3 weeks)    Specialty:  General Surgery   Contact information:   [address] [place] [address] [phone]       Signed: Candiss Norse [facility] [phone]  09/19/2013,  10:50 AM  Agree with above. Patient has passed no flatus or BM. Her husband is with her. I think that she needs to stay until evidence of bowel function.  Ovidio Kin, MD, [facility] Pager: [phone] Office phone:  [phone].

## 2013-09-19 NOTE — Progress Notes (Addendum)
[facility] Progress Note  1 Day Post-Op  Subjective: Pt doing great.  No N/V, tolerating some clears but anorexic.  Ambulated through halls.  Pain controlled on IV meds.  Switching to orals.  Wants to go home if her pains okay on orals.    Objective: Vital signs in last 24 hours: Temp:  [97.7 F (36.5 C)-98.6 F (37 C)] 98.3 F (36.8 C) (06/05 0450) Pulse Rate:  [70-77] 71 (06/05 0450) Resp:  [11-16] 16 (06/05 0450) BP: (107-142)/(62-76) 126/75 mmHg (06/05 0450) SpO2:  [99 %-100 %] 99 % (06/05 0450) Last BM Date: 09/16/13  Intake/Output from previous day: 06/04 0701 - 06/05 0700 In: 3531.7 [I.V.:3531.7] Out: 1100 [Urine:1050; Blood:50] Intake/Output this shift:    PE: Gen:  Alert, NAD, pleasant Abd: Soft, appropriate tenderness, mild distension, +BS, no HSM, incisions C/D/I   Lab Results:   Recent Labs  09/17/13 1120 09/18/13 0517  WBC 14.4* 10.4  HGB 15.5* 13.1  HCT 45.2 37.9  PLT 343 271   BMET  Recent Labs  09/17/13 1120 09/18/13 0517  NA 134* 136*  K 3.7 4.0  CL 89* 99  CO2 33* 29  GLUCOSE 126* 90  BUN 33* 21  CREATININE 0.96 0.74  CALCIUM 10.3 8.6   PT/INR No results found for this basename: LABPROT, INR,  in the last 72 hours CMP     Component Value Date/Time   NA 136* 09/18/2013 0517   K 4.0 09/18/2013 0517   CL 99 09/18/2013 0517   CO2 29 09/18/2013 0517   GLUCOSE 90 09/18/2013 0517   BUN 21 09/18/2013 0517   CREATININE 0.74 09/18/2013 0517   CALCIUM 8.6 09/18/2013 0517   PROT 7.5 09/17/2013 1120   ALBUMIN 4.5 09/17/2013 1120   AST 13 09/17/2013 1120   ALT 12 09/17/2013 1120   ALKPHOS 54 09/17/2013 1120   BILITOT 1.0 09/17/2013 1120   GFRNONAA 89* 09/18/2013 0517   GFRAA >90 09/18/2013 0517   Lipase     Component Value Date/Time   LIPASE 16 09/17/2013 1130       Studies/Results: Ct Abdomen Pelvis W Contrast  09/17/2013   CLINICAL DATA:  Intermittent abdominal pain. History of bowel obstructions. Nausea and vomiting.  EXAM: CT ABDOMEN AND  PELVIS WITH CONTRAST  TECHNIQUE: Multidetector CT imaging of the abdomen and pelvis was performed using the standard protocol following bolus administration of intravenous contrast.  CONTRAST:  OMNIPAQUE IOHEXOL 300 MG/ML  SOLN  COMPARISON:  CT abdomen and pelvis 03/20/2013.  FINDINGS: The lung bases are clear without focal nodule, mass, or airspace disease. Heart size is normal.  Abdominal ascites are evident adjacent to the free edge of the liver. Liver and spleen are otherwise unremarkable. The stomach is mildly distended. The duodenum is distended. There is distention of proximal small bowel measuring up to 4.5 cm. This can be followed to a transition point within the central mesenteries edematous changes and nodes are present within the central mesentery is well. Additionally, there is some twisting of the mesenteric vessels. Additional areas of layering ascites are present.  There is no free air. The ascending colon and is filled with stool. The transverse and descending colon is collapsed. Urinary bladder is within normal limits.  The common bile duct and gallbladder are normal. The adrenal glands are normal bilaterally. The kidneys and ureters are within normal limits. No significant adenopathy is present. Minimal atherosclerotic calcifications are present in the aorta. There is no aneurysm.  Mild degenerative changes  are noted in the lower lumbar spine. No focal lytic or blastic lesions are present.  IMPRESSION: 1. Small bowel obstruction with transition point in the central mesenteries. This raises concern for an internal hernia and closed loop obstruction. 2. There is twisting of the mesentery which supports this diagnosis is well. 3. Moderate free fluid without evidence for free air. 4. Solid organs are unremarkable. 5. Atherosclerosis without aneurysm. These results were called by telephone at the time of interpretation on 09/17/2013 at 3:27 PM to Dr. Pricilla Loveless , who verbally acknowledged these  results.   Electronically Signed   By: Gennette Pac M.D.   On: 09/17/2013 15:28    Anti-infectives: Anti-infectives   Start     Dose/Rate Route Frequency Ordered Stop   09/18/13 1230  cefoTEtan (CEFOTAN) 1 g in dextrose 5 % 50 mL IVPB     1 g 100 mL/hr over 30 Minutes Intravenous  Once 09/18/13 1154 09/18/13 1240   09/18/13 0600  cefoTEtan (CEFOTAN) 2 g in dextrose 5 % 50 mL IVPB    Comments:  Pharmacy may adjust dose strength for optimal dosing.   Send with patient on call to the OR.  Anesthesia to complete antibiotic administration <86min prior to incision per [facility].   2 g 100 mL/hr over 30 Minutes Intravenous On call to O.R. 09/17/13 1620 09/18/13 0545       Assessment/Plan 1. Recurrent small bowel obstruction POD #1 s/p Laparoscopic enterolysis of adhesions with removal of  chronic granulation point 2. History of subarachnoid hemorrhage in 1996 - no sequelae  3. Psoriatic arthritis  Plan: 1.  Advance diet 2.  Ambulate and IS 3.  If she can ambulate, urinate, pass flatus, and have pain well controlled on orals then she is good to go home today 4.  Red IVF. 5.  Heparin and SCD's 6.  Try ultram since she can't tolerate other narcotics   LOS: 2 days   Aris Georgia 09/19/2013, 10:18 AM Pager: [phone]  Agree with above. Patient has passed no flatus or BM. Her husband is with her. I think that she needs to stay until evidence of bowel function.  Ovidio Kin, MD, [facility] Pager: [phone] Office phone:  [phone]

## 2013-09-19 NOTE — Op Note (Signed)
NAMENECHUMA, BOVEN               ACCOUNT NO.:  [id number]  MEDICAL RECORD NO.:  [id number]  LOCATION:  1527                         FACILITY:  [facility]  PHYSICIAN:  Sandria Bales. Ezzard Standing, M.D.  DATE OF BIRTH:  [birth year]  DATE OF PROCEDURE:  09/18/2013                              OPERATIVE REPORT  PREOPERATIVE DIAGNOSIS:  Small-bowel obstruction.  POSTOPERATIVE DIAGNOSIS:  Small-bowel obstruction secondary to adhesive band of terminal ileum.  PROCEDURE:  Laparoscopic enterolysis of adhesions with removal of chronic granulation point. [pathology on this proved benign]  SURGEON:  Sandria Bales. Ezzard Standing, M.D.  FIRST ASSISTANT:  Aris [place], [place].  ANESTHESIA:  General endotracheal supervised by Dr. Eilene Ghazi.  ESTIMATED BLOOD LOSS:  Minimal.  DRAINS LEFT IN:  None.  Local used was 20 mL of 0.25% Marcaine plain.  SPECIMEN:  One piece of tissue that looks like chronic granulation from the adhesion point.  INDICATION FOR PROCEDURE:  Ms. Jobin is a 63 year old white female who sees Dr. Herb Grays as her medical doctor.  She has had several months of chronic recurrent abdominal pain.  She saw Dr. Johna Sheriff several months ago for consideration for laparoscopic exploration, but decided to wait until she had another acute episode.  She presented to the emergency room yesterday with evidence of bowel obstruction on CT scan.  There appeared to be an abnormal area in her RLQ on the CT scan that was similar to what was seen in December, 2014.  She now comes for laparoscopic exploration.  The indications and potential risks of surgery were explained to the patient.  Potential risks include, but not limited to, bleeding, infection, open surgery, and bowel resection.  OPERATIVE NOTE:  The patient placed in a supine position in room #6 at [facility].  She had both her arms tucked and Foley catheter in place.  NG tube was already in  place.  She was on cefotetan as an antibiotic.  A time-out was held  and surgical checklist run.  Her abdomen was prepped with ChloraPrep, I have had a Foley placed in her.  I accessed her abdominal cavity through an infraumbilical incision using a 5-mm Hasson trocar.  I entered the abdominal cavity.  I placed 2 additional trocars in the left upper quadrant 5 mm and in the left lower quadrant 5 mm trocar.  Abdomen was explored.  The right and left lobes of liver was unremarkable.  Stomach was decompressed and unremarkable.    She had significantly dilated small bowel close going almost all the way to her terminal ileum, however, as I mobilized this I was able to find an adhesive band between her retroperitoneum, terminal ileum, and an area that trapped her ileum about 30 cm proximal to the terminal ileum. I divided this band, there was a  ball of tissue, which was about 1 x 3 cm.  I removed this and sent to Pathology.  Photos were in the chart of this procedure.  I did find where the small bowel was chronically crimped from this adhesion.  I was able to see dilated bowel proximal to the adhesion, decompressed bowel distal to the adhesion.  I ran the bowel distal to the  obstruction to the ileocecal valve and cecum.  The bowel was decompressed but looked okay.  I did not try to run the small bowel proximally because it was so dilated and there was no other obvious obstruction, mass, or nodule.  I irrigated the abdomen with 500 mL of saline.  She had about 500 mL of ascitic fluid, which I think was just due to the chronically obstructed small bowel.  The umbilical port was closed with 0-Vicryl suture.  The skin at each port closed with a 5-0 Monocryl suture and painted with Dermabond.  Sponge and needle count were correct at the end of the case.  The patient tolerated the procedure well.  Her Foley and NG tube were removed in the operating room, and transferred to the recovery room in good condition.   Sandria Bales. Ezzard Standing, M.D., FACS   DHN/MEDQ  D:  09/18/2013  T:  09/19/2013   Job:  [id number]  cc:   Tammy R. Collins [place], M.D. Fax: [phone]

## 2013-09-20 NOTE — Discharge Summary (Signed)
Physician Discharge Summary  Patient ID: Kylie Peterson MRN: [id number] DOB/AGE: [birth year] 64 y.o.  Admit date: 09/17/2013 Discharge date: 09/20/2013  Admission Diagnoses: SBO  Discharge Diagnoses: same Active Problems:   * No active hospital problems. *   Discharged Condition: good  Hospital Course: Patient admitted with SBO.  Failed conservative management.  Patient was taken to the OR on 6/4 for lap LOA.  She recovered very quickly and began to have bowel function on the evening of POD 1.  She was passing flatus on POD 2 and was determined to be in stable condition for d/c home.    Consults: None  Significant Diagnostic Studies: labs: cbc, chemistry  Treatments: IV hydration, analgesia: acetaminophen w/ codeine and surgery: lap loa  Discharge Exam: Blood pressure 103/67, pulse 72, temperature 98.7 F (37.1 C), temperature source Oral, resp. rate 18, height 5\' 3"  (1.6 m), weight 123 lb (55.792 kg), SpO2 98.00%. General appearance: alert and cooperative GI: normal findings: soft, non-tender Incision/Wound: clean, dry, intact  Disposition: 01-Home or Self Care     Medication List         docusate sodium 250 MG capsule  Commonly known as:  COLACE  Take 1 capsule (250 mg total) by mouth 2 (two) times daily as needed for constipation.     meloxicam 15 MG tablet  Commonly known as:  MOBIC  Take 15 mg by mouth daily.     multivitamin tablet  Take 1 tablet by mouth daily.     omeprazole 20 MG tablet  Commonly known as:  PRILOSEC OTC  Take 20 mg by mouth daily.     ondansetron 4 MG tablet  Commonly known as:  ZOFRAN  Take 1 tablet (4 mg total) by mouth every 8 (eight) hours as needed for nausea or vomiting.     polyethylene glycol powder powder  Commonly known as:  MIRALAX  Take 8.5-34 g by mouth 2 (two) times daily as needed for mild constipation, moderate constipation or severe constipation. To correct constipation.  Adjust dose over 1-2 months.  Goal = ~1 bowel  movement / day     traMADol 50 MG tablet  Commonly known as:  ULTRAM  Take 1-2 tablets (50-100 mg total) by mouth every 6 (six) hours as needed for moderate pain or severe pain.           Follow-up Information   Follow up with [facility], MD. Schedule an appointment as soon as possible for a visit in 2 weeks. (For post-operation check in 2-3 weeks)    Specialty:  General Surgery   Contact information:   [address] [place] [phone]       Signed: [phone] 09/20/2013, 10:14 AM

## 2013-09-20 NOTE — Discharge Instructions (Signed)

## 2013-09-20 NOTE — Progress Notes (Signed)
Pt tolerated a regular lunch, reported no nausea or vomiting. Per order, pt was discharged.

## 2013-09-22 ENCOUNTER — Ambulatory Visit: Payer: BC Managed Care – PPO | Admitting: Internal Medicine

## 2013-09-23 ENCOUNTER — Telehealth (INDEPENDENT_AMBULATORY_CARE_PROVIDER_SITE_OTHER): Payer: Self-pay

## 2013-09-23 NOTE — Telephone Encounter (Signed)
V/M appt with DR. [facility] 10/02/13 @ 5:15p

## 2013-10-02 ENCOUNTER — Encounter (INDEPENDENT_AMBULATORY_CARE_PROVIDER_SITE_OTHER): Payer: Self-pay | Admitting: Surgery

## 2013-10-02 ENCOUNTER — Ambulatory Visit (INDEPENDENT_AMBULATORY_CARE_PROVIDER_SITE_OTHER): Payer: BC Managed Care – PPO | Admitting: Surgery

## 2013-10-02 VITALS — BP 118/62 | HR 80 | Temp 98.0°F | Resp 18 | Ht 66.0 in | Wt 120.0 lb

## 2013-10-02 DIAGNOSIS — K56609 Unspecified intestinal obstruction, unspecified as to partial versus complete obstruction: Secondary | ICD-10-CM

## 2013-10-02 NOTE — Progress Notes (Signed)
[facility]  Ovidio Kin, MD,  FACS [address] [place]  [address] [place], [place]    [address] Phone:  [phone] FAX:  [phone]   Re:   Kylie Peterson DOB:   February 08, 1951 MRN:   [id number]  ASSESSMENT AND PLAN: 1.  Laparoscopic enterolysis - 09/18/2013 - D. Newman  There was a small mass as part of the adhesions that I removed.  This was sent to pathology - this proved to be necrotic fibrous tissue ([id number]).  Doing much better - no further symptoms.  Her return app is PRN.  2. History of subarachnoid hemorrhage in 1996 - no sequelae  3. Psoriatic arthritis  HISTORY OF PRESENT ILLNESS: Chief Complaint  Patient presents with  . Routine Post Op    adhesions   Kylie Peterson is a 63 y.o. (DOB: 1950/06/10)  white  female who is a patient of SPEAR, TAMMY, MD and comes to me today for follow up of enterolysis of adhesion. She comes by herself. She has done well.  She has had no more bowel obstruction attacks.  Past Medical History  Diagnosis Date  . Arthritis     osteoarthritis  . PONV (postoperative nausea and vomiting)   . Subarachnoid hemorrhage 1996    at age 6- has some weakness on right side( not really any residual)  . Small bowel obstruction 05/24/2005  . Hemorrhoids   . Aneurysm 1996    Dr. Herbert Seta consulted, no surgery  . Psoriatic arthritis   . Psoriasis     On back    SOCIAL HISTORY: Married.  PHYSICAL EXAM: Ht 5\' 6"  (1.676 m)  Wt 120 lb (54.432 kg)  BMI 19.38 kg/m2  General: WN WF who is alert and generally healthy appearing.  HEENT: Normal. Pupils equal.  Lungs: Clear to auscultation and symmetric breath sounds. Heart:  RRR. No murmur or rub. Abdomen: Soft. No mass. No tenderness. Incisions okay.  DATA REVIEWED: Epic notes.  Path to patient.  , MD,  [facility] [address] [place]  [address]   [place], [place]    [place] Phone:  [phone] FAX:  [phone]

## 2014-10-17 IMAGING — CT CT ABD-PELV W/ CM
1 of 3 series · 13 of 32 positions shown, 18 images · IV contrast (OMNIPAQUE 300)
Comparison: CT abdomen and pelvis 03/20/2013.

CLINICAL DATA: Intermittent abdominal pain. History of bowel
obstructions. Nausea and vomiting.

EXAM:
CT ABDOMEN AND PELVIS WITH CONTRAST
TECHNIQUE: Multidetector CT imaging of the abdomen and pelvis was performed
using the standard protocol following bolus administration of
intravenous contrast.
CONTRAST:  100mL OMNIPAQUE IOHEXOL 300 MG/ML  SOLN

[Series 2: abd/pel with · axial · 0.63mm/px · z∈[-375,+10]mm · 13 of 87 slices shown, 18 images]
[im 5/87  soft-tissue]
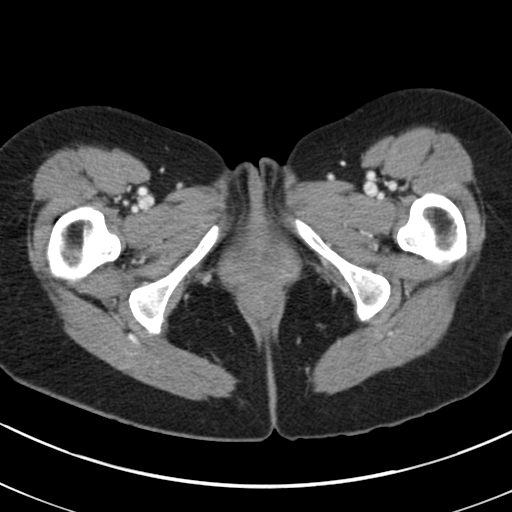
[im 5/87  bone]
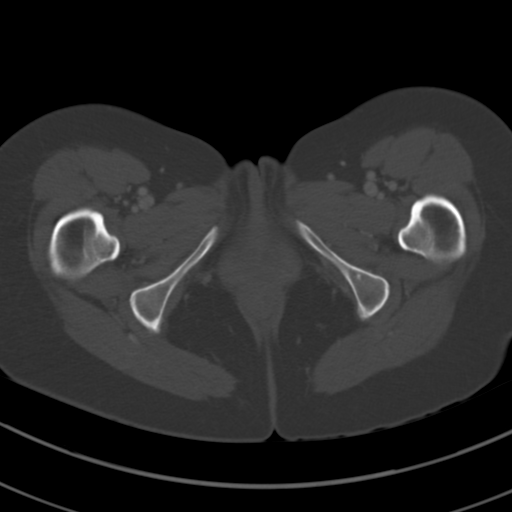
[im 13/87  soft-tissue]
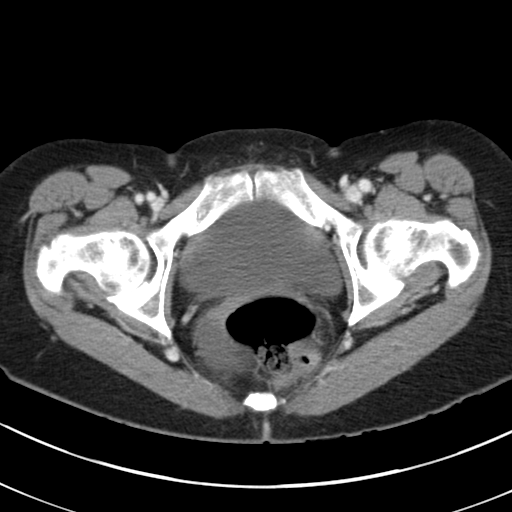
[im 18/87  soft-tissue]
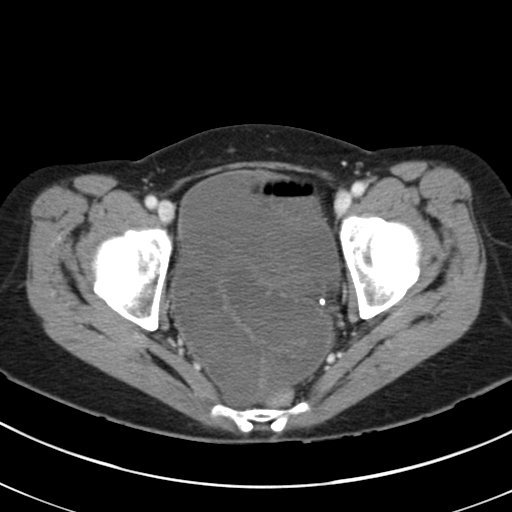
[im 26/87  soft-tissue]
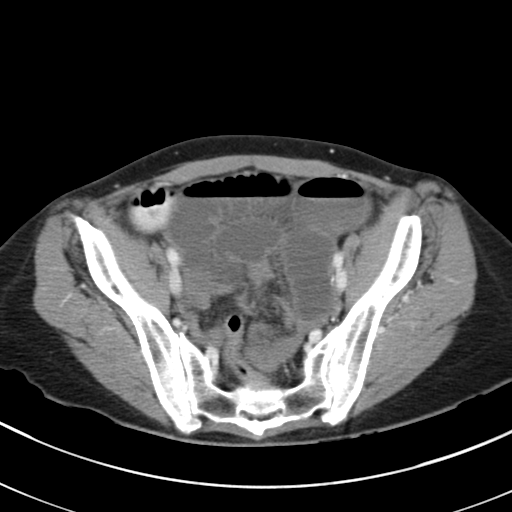
[im 35/87  soft-tissue]
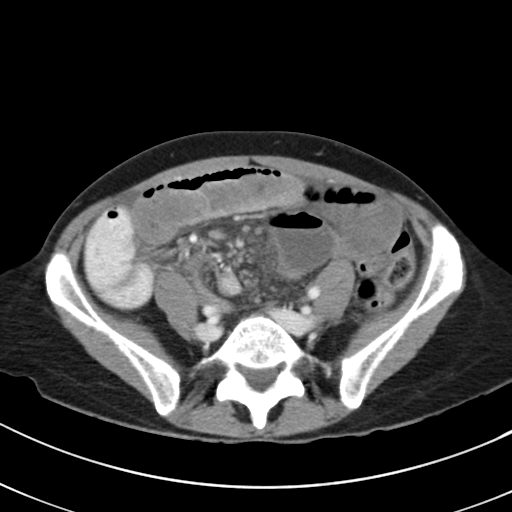
[im 39/87  soft-tissue]
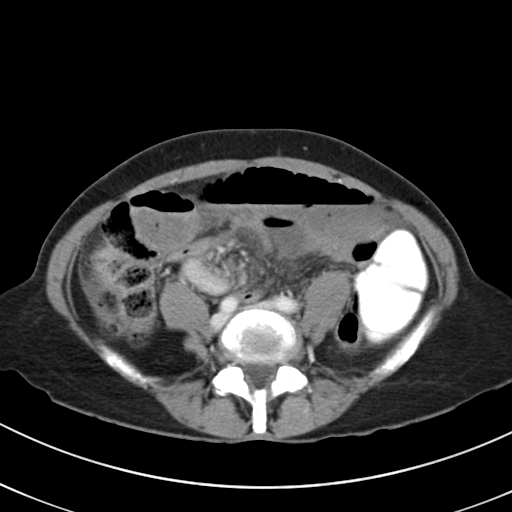
[im 48/87  soft-tissue]
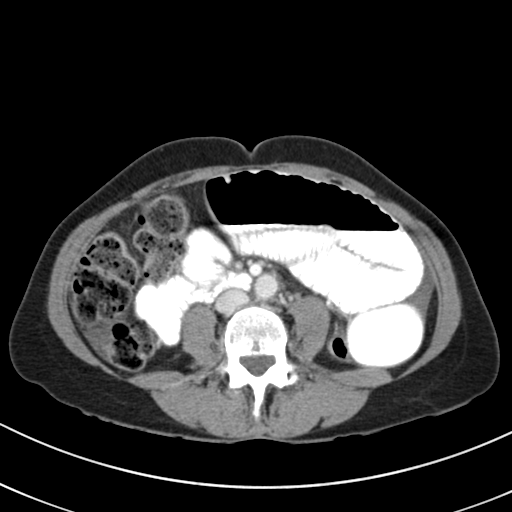
[im 52/87  soft-tissue]
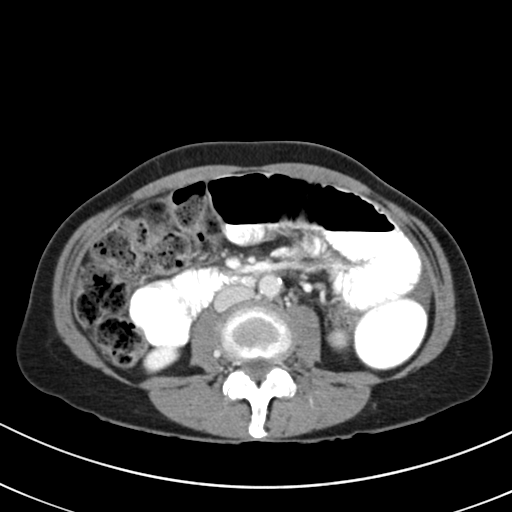
[im 61/87  soft-tissue]
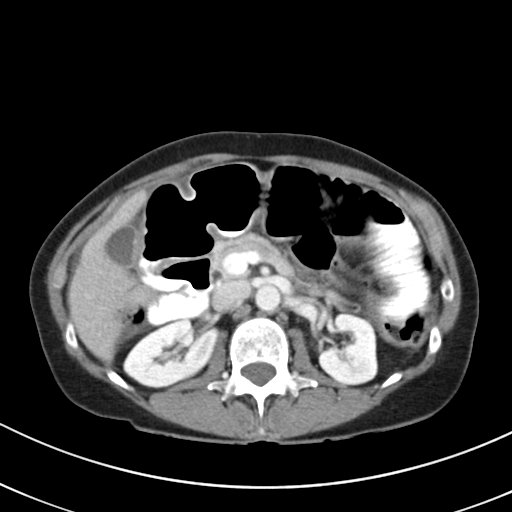
[im 61/87  bone]
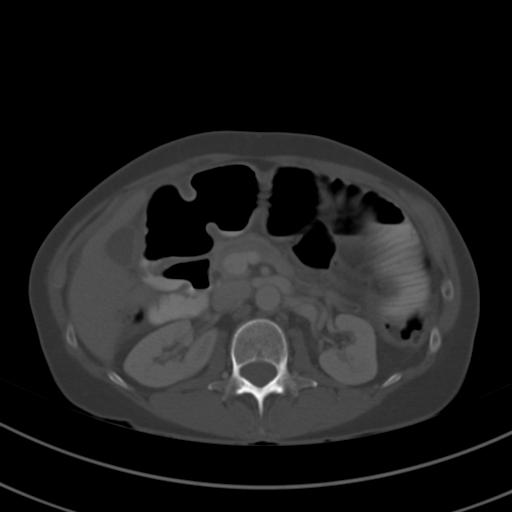
[im 69/87  soft-tissue]
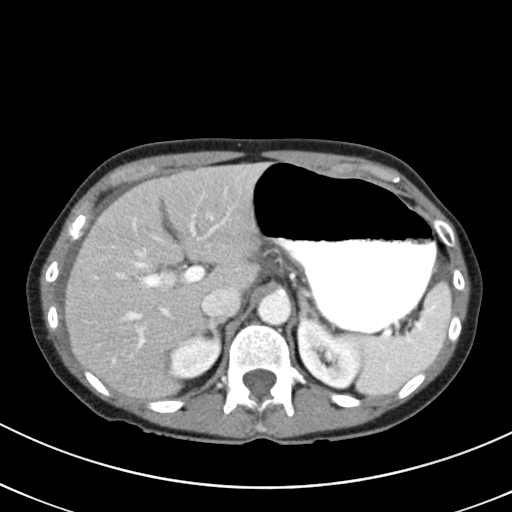
[im 69/87  lung]
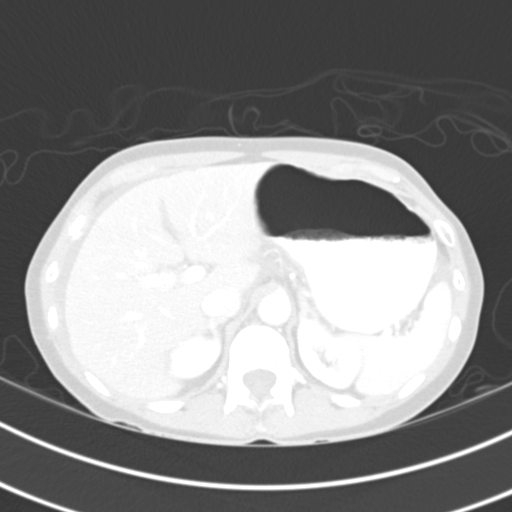
[im 74/87  soft-tissue]
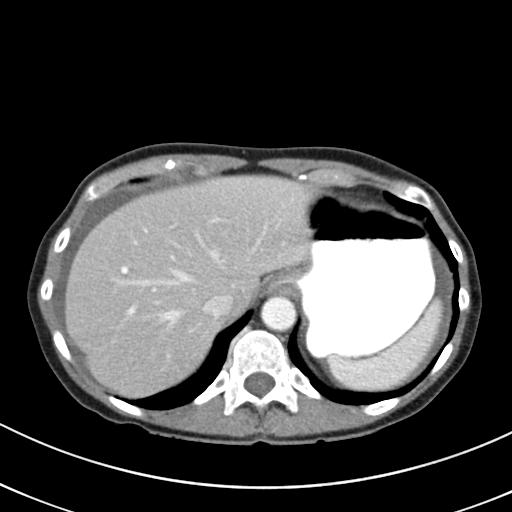
[im 74/87  lung]
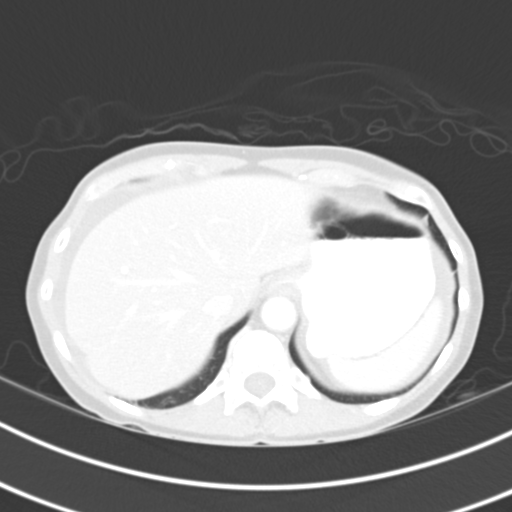
[im 78/87  lung]
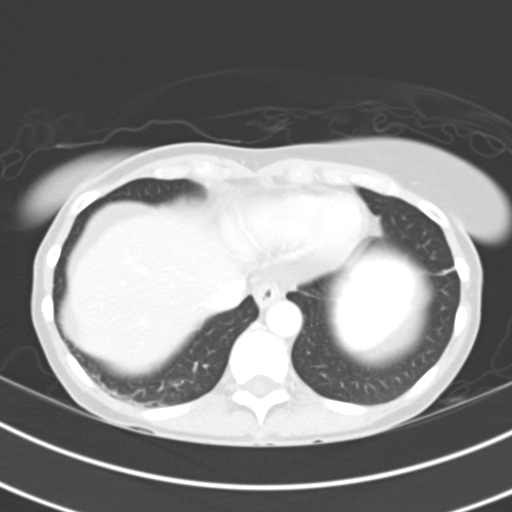
[im 82/87  soft-tissue]
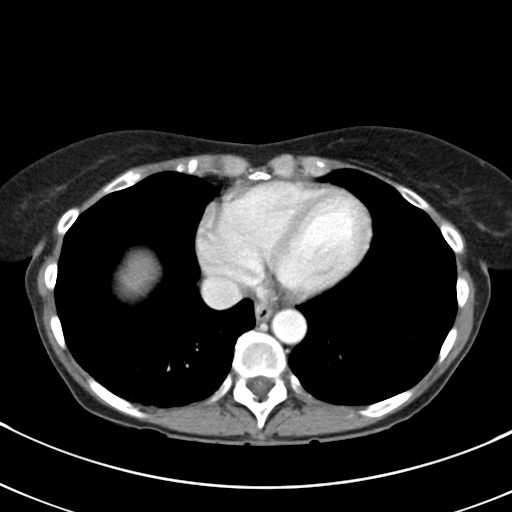
[im 82/87  lung]
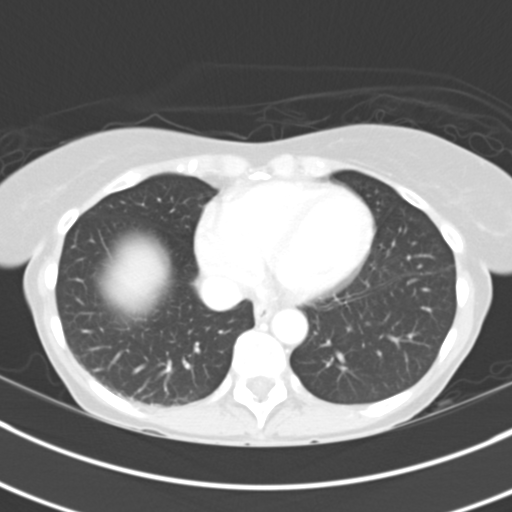

[13 of 32 positions shown; findings below may reference images not displayed]

FINDINGS: The lung bases are clear without focal nodule, mass, or airspace
disease. Heart size is normal.

Abdominal ascites are evident adjacent to the free edge of the
liver. Liver and spleen are otherwise unremarkable. The stomach is
mildly distended. The duodenum is distended. There is distention of
proximal small bowel measuring up to 4.5 cm. This can be followed to
a transition point within the central mesenteries edematous changes
and nodes are present within the central mesentery is well.
Additionally, there is some twisting of the mesenteric vessels.
Additional areas of layering ascites are present.

There is no free air. The ascending colon and is filled with stool.
The transverse and descending colon is collapsed. Urinary bladder is
within normal limits.

The common bile duct and gallbladder are normal. The adrenal glands
are normal bilaterally. The kidneys and ureters are within normal
limits. No significant adenopathy is present. Minimal
atherosclerotic calcifications are present in the aorta. There is no
aneurysm.

Mild degenerative changes are noted in the lower lumbar spine. No
focal lytic or blastic lesions are present.
IMPRESSION: 1. Small bowel obstruction with transition point in the central
mesenteries. This raises concern for an internal hernia and closed
loop obstruction.
2. There is twisting of the mesentery which supports this diagnosis
is well.
3. Moderate free fluid without evidence for free air.
4. Solid organs are unremarkable.
5. Atherosclerosis without aneurysm.
These results were called by telephone at the time of interpretation
on 09/17/2013 at [DATE] to Dr. QOMANDAN TIGER , who verbally
acknowledged these results.

## 2015-04-22 DIAGNOSIS — J018 Other acute sinusitis: Secondary | ICD-10-CM | POA: Diagnosis not present

## 2015-09-06 DIAGNOSIS — Z791 Long term (current) use of non-steroidal anti-inflammatories (NSAID): Secondary | ICD-10-CM | POA: Diagnosis not present

## 2015-09-06 DIAGNOSIS — M150 Primary generalized (osteo)arthritis: Secondary | ICD-10-CM | POA: Diagnosis not present

## 2015-09-06 DIAGNOSIS — M7061 Trochanteric bursitis, right hip: Secondary | ICD-10-CM | POA: Diagnosis not present

## 2015-09-06 DIAGNOSIS — M2550 Pain in unspecified joint: Secondary | ICD-10-CM | POA: Diagnosis not present

## 2015-09-06 DIAGNOSIS — M79641 Pain in right hand: Secondary | ICD-10-CM | POA: Diagnosis not present

## 2015-09-06 DIAGNOSIS — M79642 Pain in left hand: Secondary | ICD-10-CM | POA: Diagnosis not present

## 2016-09-05 DIAGNOSIS — Z791 Long term (current) use of non-steroidal anti-inflammatories (NSAID): Secondary | ICD-10-CM | POA: Diagnosis not present

## 2016-09-05 DIAGNOSIS — Z6823 Body mass index (BMI) 23.0-23.9, adult: Secondary | ICD-10-CM | POA: Diagnosis not present

## 2016-09-05 DIAGNOSIS — M2550 Pain in unspecified joint: Secondary | ICD-10-CM | POA: Diagnosis not present

## 2016-09-05 DIAGNOSIS — M150 Primary generalized (osteo)arthritis: Secondary | ICD-10-CM | POA: Diagnosis not present

## 2016-10-16 DIAGNOSIS — H6123 Impacted cerumen, bilateral: Secondary | ICD-10-CM | POA: Diagnosis not present

## 2016-10-16 DIAGNOSIS — R590 Localized enlarged lymph nodes: Secondary | ICD-10-CM | POA: Diagnosis not present

## 2017-03-12 DIAGNOSIS — M150 Primary generalized (osteo)arthritis: Secondary | ICD-10-CM | POA: Diagnosis not present

## 2017-03-12 DIAGNOSIS — L409 Psoriasis, unspecified: Secondary | ICD-10-CM | POA: Diagnosis not present

## 2017-03-12 DIAGNOSIS — Z79899 Other long term (current) drug therapy: Secondary | ICD-10-CM | POA: Diagnosis not present

## 2017-03-12 DIAGNOSIS — M2550 Pain in unspecified joint: Secondary | ICD-10-CM | POA: Diagnosis not present

## 2017-03-12 DIAGNOSIS — Z6824 Body mass index (BMI) 24.0-24.9, adult: Secondary | ICD-10-CM | POA: Diagnosis not present

## 2017-09-04 DIAGNOSIS — M530 Cervicocranial syndrome: Secondary | ICD-10-CM | POA: Diagnosis not present

## 2017-09-04 DIAGNOSIS — M9902 Segmental and somatic dysfunction of thoracic region: Secondary | ICD-10-CM | POA: Diagnosis not present

## 2017-09-04 DIAGNOSIS — M9901 Segmental and somatic dysfunction of cervical region: Secondary | ICD-10-CM | POA: Diagnosis not present

## 2017-09-04 DIAGNOSIS — M9903 Segmental and somatic dysfunction of lumbar region: Secondary | ICD-10-CM | POA: Diagnosis not present

## 2017-09-05 DIAGNOSIS — M9901 Segmental and somatic dysfunction of cervical region: Secondary | ICD-10-CM | POA: Diagnosis not present

## 2017-09-05 DIAGNOSIS — M9903 Segmental and somatic dysfunction of lumbar region: Secondary | ICD-10-CM | POA: Diagnosis not present

## 2017-09-05 DIAGNOSIS — M9902 Segmental and somatic dysfunction of thoracic region: Secondary | ICD-10-CM | POA: Diagnosis not present

## 2017-09-05 DIAGNOSIS — M530 Cervicocranial syndrome: Secondary | ICD-10-CM | POA: Diagnosis not present

## 2017-09-11 DIAGNOSIS — M9902 Segmental and somatic dysfunction of thoracic region: Secondary | ICD-10-CM | POA: Diagnosis not present

## 2017-09-11 DIAGNOSIS — M9901 Segmental and somatic dysfunction of cervical region: Secondary | ICD-10-CM | POA: Diagnosis not present

## 2017-09-11 DIAGNOSIS — Z791 Long term (current) use of non-steroidal anti-inflammatories (NSAID): Secondary | ICD-10-CM | POA: Diagnosis not present

## 2017-09-11 DIAGNOSIS — M530 Cervicocranial syndrome: Secondary | ICD-10-CM | POA: Diagnosis not present

## 2017-09-11 DIAGNOSIS — M2550 Pain in unspecified joint: Secondary | ICD-10-CM | POA: Diagnosis not present

## 2017-09-11 DIAGNOSIS — M9903 Segmental and somatic dysfunction of lumbar region: Secondary | ICD-10-CM | POA: Diagnosis not present

## 2017-09-11 DIAGNOSIS — Z6821 Body mass index (BMI) 21.0-21.9, adult: Secondary | ICD-10-CM | POA: Diagnosis not present

## 2017-09-11 DIAGNOSIS — M150 Primary generalized (osteo)arthritis: Secondary | ICD-10-CM | POA: Diagnosis not present

## 2017-09-11 DIAGNOSIS — L409 Psoriasis, unspecified: Secondary | ICD-10-CM | POA: Diagnosis not present

## 2017-09-12 DIAGNOSIS — M9901 Segmental and somatic dysfunction of cervical region: Secondary | ICD-10-CM | POA: Diagnosis not present

## 2017-09-12 DIAGNOSIS — M9902 Segmental and somatic dysfunction of thoracic region: Secondary | ICD-10-CM | POA: Diagnosis not present

## 2017-09-12 DIAGNOSIS — M9903 Segmental and somatic dysfunction of lumbar region: Secondary | ICD-10-CM | POA: Diagnosis not present

## 2017-09-12 DIAGNOSIS — M530 Cervicocranial syndrome: Secondary | ICD-10-CM | POA: Diagnosis not present

## 2017-09-13 DIAGNOSIS — M9901 Segmental and somatic dysfunction of cervical region: Secondary | ICD-10-CM | POA: Diagnosis not present

## 2017-09-13 DIAGNOSIS — M9903 Segmental and somatic dysfunction of lumbar region: Secondary | ICD-10-CM | POA: Diagnosis not present

## 2017-09-13 DIAGNOSIS — M530 Cervicocranial syndrome: Secondary | ICD-10-CM | POA: Diagnosis not present

## 2017-09-13 DIAGNOSIS — M9902 Segmental and somatic dysfunction of thoracic region: Secondary | ICD-10-CM | POA: Diagnosis not present

## 2017-09-17 DIAGNOSIS — M9903 Segmental and somatic dysfunction of lumbar region: Secondary | ICD-10-CM | POA: Diagnosis not present

## 2017-09-17 DIAGNOSIS — M9902 Segmental and somatic dysfunction of thoracic region: Secondary | ICD-10-CM | POA: Diagnosis not present

## 2017-09-17 DIAGNOSIS — M530 Cervicocranial syndrome: Secondary | ICD-10-CM | POA: Diagnosis not present

## 2017-09-17 DIAGNOSIS — M9901 Segmental and somatic dysfunction of cervical region: Secondary | ICD-10-CM | POA: Diagnosis not present

## 2017-09-25 DIAGNOSIS — M9903 Segmental and somatic dysfunction of lumbar region: Secondary | ICD-10-CM | POA: Diagnosis not present

## 2017-09-25 DIAGNOSIS — M530 Cervicocranial syndrome: Secondary | ICD-10-CM | POA: Diagnosis not present

## 2017-09-25 DIAGNOSIS — M9902 Segmental and somatic dysfunction of thoracic region: Secondary | ICD-10-CM | POA: Diagnosis not present

## 2017-09-25 DIAGNOSIS — M9901 Segmental and somatic dysfunction of cervical region: Secondary | ICD-10-CM | POA: Diagnosis not present

## 2017-09-27 DIAGNOSIS — M9901 Segmental and somatic dysfunction of cervical region: Secondary | ICD-10-CM | POA: Diagnosis not present

## 2017-09-27 DIAGNOSIS — M530 Cervicocranial syndrome: Secondary | ICD-10-CM | POA: Diagnosis not present

## 2017-09-27 DIAGNOSIS — M9903 Segmental and somatic dysfunction of lumbar region: Secondary | ICD-10-CM | POA: Diagnosis not present

## 2017-09-27 DIAGNOSIS — M9902 Segmental and somatic dysfunction of thoracic region: Secondary | ICD-10-CM | POA: Diagnosis not present

## 2017-10-01 DIAGNOSIS — M530 Cervicocranial syndrome: Secondary | ICD-10-CM | POA: Diagnosis not present

## 2017-10-01 DIAGNOSIS — M9901 Segmental and somatic dysfunction of cervical region: Secondary | ICD-10-CM | POA: Diagnosis not present

## 2017-10-01 DIAGNOSIS — M9903 Segmental and somatic dysfunction of lumbar region: Secondary | ICD-10-CM | POA: Diagnosis not present

## 2017-10-01 DIAGNOSIS — M9902 Segmental and somatic dysfunction of thoracic region: Secondary | ICD-10-CM | POA: Diagnosis not present

## 2017-10-03 DIAGNOSIS — M530 Cervicocranial syndrome: Secondary | ICD-10-CM | POA: Diagnosis not present

## 2017-10-03 DIAGNOSIS — M9902 Segmental and somatic dysfunction of thoracic region: Secondary | ICD-10-CM | POA: Diagnosis not present

## 2017-10-03 DIAGNOSIS — M9901 Segmental and somatic dysfunction of cervical region: Secondary | ICD-10-CM | POA: Diagnosis not present

## 2017-10-03 DIAGNOSIS — M9903 Segmental and somatic dysfunction of lumbar region: Secondary | ICD-10-CM | POA: Diagnosis not present

## 2017-10-08 DIAGNOSIS — M9902 Segmental and somatic dysfunction of thoracic region: Secondary | ICD-10-CM | POA: Diagnosis not present

## 2017-10-08 DIAGNOSIS — M9903 Segmental and somatic dysfunction of lumbar region: Secondary | ICD-10-CM | POA: Diagnosis not present

## 2017-10-08 DIAGNOSIS — M530 Cervicocranial syndrome: Secondary | ICD-10-CM | POA: Diagnosis not present

## 2017-10-08 DIAGNOSIS — M9901 Segmental and somatic dysfunction of cervical region: Secondary | ICD-10-CM | POA: Diagnosis not present

## 2017-10-10 DIAGNOSIS — M9902 Segmental and somatic dysfunction of thoracic region: Secondary | ICD-10-CM | POA: Diagnosis not present

## 2017-10-10 DIAGNOSIS — M530 Cervicocranial syndrome: Secondary | ICD-10-CM | POA: Diagnosis not present

## 2017-10-10 DIAGNOSIS — M9903 Segmental and somatic dysfunction of lumbar region: Secondary | ICD-10-CM | POA: Diagnosis not present

## 2017-10-10 DIAGNOSIS — M9901 Segmental and somatic dysfunction of cervical region: Secondary | ICD-10-CM | POA: Diagnosis not present

## 2017-10-15 DIAGNOSIS — B958 Unspecified staphylococcus as the cause of diseases classified elsewhere: Secondary | ICD-10-CM | POA: Diagnosis not present

## 2017-10-15 DIAGNOSIS — R21 Rash and other nonspecific skin eruption: Secondary | ICD-10-CM | POA: Diagnosis not present

## 2017-10-15 DIAGNOSIS — L089 Local infection of the skin and subcutaneous tissue, unspecified: Secondary | ICD-10-CM | POA: Diagnosis not present

## 2017-10-15 DIAGNOSIS — B356 Tinea cruris: Secondary | ICD-10-CM | POA: Diagnosis not present

## 2017-10-26 DIAGNOSIS — E162 Hypoglycemia, unspecified: Secondary | ICD-10-CM | POA: Diagnosis not present

## 2017-10-26 DIAGNOSIS — E559 Vitamin D deficiency, unspecified: Secondary | ICD-10-CM | POA: Diagnosis not present

## 2017-10-26 DIAGNOSIS — R5383 Other fatigue: Secondary | ICD-10-CM | POA: Diagnosis not present

## 2017-10-26 DIAGNOSIS — Z0000 Encounter for general adult medical examination without abnormal findings: Secondary | ICD-10-CM | POA: Diagnosis not present

## 2017-10-26 DIAGNOSIS — R072 Precordial pain: Secondary | ICD-10-CM | POA: Diagnosis not present

## 2017-10-29 DIAGNOSIS — M9901 Segmental and somatic dysfunction of cervical region: Secondary | ICD-10-CM | POA: Diagnosis not present

## 2017-10-29 DIAGNOSIS — M530 Cervicocranial syndrome: Secondary | ICD-10-CM | POA: Diagnosis not present

## 2017-10-29 DIAGNOSIS — M9902 Segmental and somatic dysfunction of thoracic region: Secondary | ICD-10-CM | POA: Diagnosis not present

## 2017-10-29 DIAGNOSIS — M9903 Segmental and somatic dysfunction of lumbar region: Secondary | ICD-10-CM | POA: Diagnosis not present

## 2017-10-31 DIAGNOSIS — H60332 Swimmer's ear, left ear: Secondary | ICD-10-CM | POA: Diagnosis not present

## 2017-10-31 DIAGNOSIS — H6123 Impacted cerumen, bilateral: Secondary | ICD-10-CM | POA: Diagnosis not present

## 2018-03-05 DIAGNOSIS — Z791 Long term (current) use of non-steroidal anti-inflammatories (NSAID): Secondary | ICD-10-CM | POA: Diagnosis not present

## 2018-03-05 DIAGNOSIS — Z6821 Body mass index (BMI) 21.0-21.9, adult: Secondary | ICD-10-CM | POA: Diagnosis not present

## 2018-03-05 DIAGNOSIS — L409 Psoriasis, unspecified: Secondary | ICD-10-CM | POA: Diagnosis not present

## 2018-03-05 DIAGNOSIS — M150 Primary generalized (osteo)arthritis: Secondary | ICD-10-CM | POA: Diagnosis not present

## 2018-03-05 DIAGNOSIS — M2550 Pain in unspecified joint: Secondary | ICD-10-CM | POA: Diagnosis not present

## 2018-04-15 DIAGNOSIS — L4050 Arthropathic psoriasis, unspecified: Secondary | ICD-10-CM | POA: Diagnosis not present

## 2018-04-15 DIAGNOSIS — I8393 Asymptomatic varicose veins of bilateral lower extremities: Secondary | ICD-10-CM | POA: Diagnosis not present

## 2018-04-15 DIAGNOSIS — E2839 Other primary ovarian failure: Secondary | ICD-10-CM | POA: Diagnosis not present

## 2018-04-23 ENCOUNTER — Other Ambulatory Visit: Payer: Self-pay

## 2018-04-23 DIAGNOSIS — I83893 Varicose veins of bilateral lower extremities with other complications: Secondary | ICD-10-CM

## 2018-04-30 DIAGNOSIS — M069 Rheumatoid arthritis, unspecified: Secondary | ICD-10-CM | POA: Diagnosis not present

## 2018-04-30 DIAGNOSIS — Z8262 Family history of osteoporosis: Secondary | ICD-10-CM | POA: Diagnosis not present

## 2018-04-30 DIAGNOSIS — Z1231 Encounter for screening mammogram for malignant neoplasm of breast: Secondary | ICD-10-CM | POA: Diagnosis not present

## 2018-04-30 DIAGNOSIS — M8589 Other specified disorders of bone density and structure, multiple sites: Secondary | ICD-10-CM | POA: Diagnosis not present

## 2018-04-30 DIAGNOSIS — Z90710 Acquired absence of both cervix and uterus: Secondary | ICD-10-CM | POA: Diagnosis not present

## 2018-04-30 DIAGNOSIS — Z780 Asymptomatic menopausal state: Secondary | ICD-10-CM | POA: Diagnosis not present

## 2018-06-04 ENCOUNTER — Ambulatory Visit (HOSPITAL_COMMUNITY)
Admission: RE | Admit: 2018-06-04 | Discharge: 2018-06-04 | Disposition: A | Discharge status: Home / Self Care | Payer: PPO | Source: Ambulatory Visit | Attending: Vascular Surgery | Admitting: Vascular Surgery

## 2018-06-04 ENCOUNTER — Ambulatory Visit (INDEPENDENT_AMBULATORY_CARE_PROVIDER_SITE_OTHER): Payer: PPO | Admitting: Vascular Surgery

## 2018-06-04 ENCOUNTER — Other Ambulatory Visit: Payer: Self-pay

## 2018-06-04 ENCOUNTER — Encounter: Payer: Self-pay | Admitting: Vascular Surgery

## 2018-06-04 VITALS — BP 112/66 | HR 76 | Temp 97.9°F | Resp 18 | Ht 66.0 in | Wt 126.4 lb

## 2018-06-04 DIAGNOSIS — I83893 Varicose veins of bilateral lower extremities with other complications: Secondary | ICD-10-CM | POA: Diagnosis not present

## 2018-06-04 NOTE — Progress Notes (Signed)
[facility] [place]  Patient name: Kylie Peterson MRN: [id number] DOB: [birth year] Sex: female  REASON FOR CONSULT: Evaluation of lower extremity venous pathology left greater than right  HPI: Kylie Peterson is a 68 y.o. female, who is here today for evaluation of discomfort of left greater than right lower extremities.  She reports family history of venous pathology and is concerned regarding this.  She does have scattered small telangiectasia and varicosities.  She has pain over the lateral aspect of her left thigh and calf.  She reports that this is most noted after she is exercise throughout the day but some can at times can notice it with exercise as well.  Has no history of DVT.  She does sense a lumpy sensation on her lateral thigh when she feels the discomfort.  No history of peripheral vascular occlusive disease.  Past Medical History:  Diagnosis Date  . Aneurysm [facility]) 1996   Dr. Herbert Seta consulted, no surgery  . Arthritis    osteoarthritis  . Hemorrhoids   . PONV (postoperative nausea and vomiting)   . Psoriasis    On back  . Psoriatic arthritis (HCC)   . Small bowel obstruction (HCC) 05/24/2005  . Subarachnoid hemorrhage (HCC) 1996   at age 61- has some weakness on right side( not really any residual)    History reviewed. No pertinent family history.  SOCIAL HISTORY: Social History   Socioeconomic History  . Marital status: Married    Spouse name: Not on file  . Number of children: Not on file  . Years of education: Not on file  . Highest education level: Not on file  Occupational History  . 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 Financial resource strain: Not on file  . Food insecurity:    Worry: Not on file    Inability: Not on file  . Transportation needs:    Medical: Not on file    Non-medical: Not on file  Tobacco Use  . Smoking status: Never Smoker  .  Smokeless tobacco: Never Used  Substance and Sexual Activity  . Alcohol use: Yes    Comment: glass of wine once a month  . Drug use: No  . Sexual activity: Not on file  Lifestyle  . Physical activity:    Days per week: Not on file    Minutes per session: Not on file  . Stress: Not on file  Relationships  . Social connections:    Talks on phone: Not on file    Gets together: Not on file    Attends religious service: Not on file    Active member of club or organization: Not on file    Attends meetings of clubs or organizations: Not on file    Relationship status: Not on file  . Intimate partner violence:    Fear of current or ex partner: Not on file    Emotionally abused: Not on file    Physically abused: Not on file    Forced sexual activity: Not on file  Other Topics Concern  . Not on file  Social History Narrative  . Not on file    Allergies  Allergen Reactions  . Darvocet [Propoxyphene N-Acetaminophen] Nausea And Vomiting  . Percocet [Oxycodone-Acetaminophen] Nausea And Vomiting  . Vicodin [Hydrocodone-Acetaminophen] Nausea And Vomiting  . Sulfamethoxazole Rash    Current Outpatient Medications  Medication Sig Dispense Refill  . docusate sodium (COLACE)  250 MG capsule Take 1 capsule (250 mg total) by mouth 2 (two) times daily as needed for constipation. 10 capsule 0  . meloxicam (MOBIC) 15 MG tablet Take 15 mg by mouth daily.    . Multiple Vitamin (MULTIVITAMIN) tablet Take 1 tablet by mouth daily.    Marland Kitchen omeprazole (PRILOSEC OTC) 20 MG tablet Take 20 mg by mouth daily.    . ondansetron (ZOFRAN) 4 MG tablet Take 1 tablet (4 mg total) by mouth every 8 (eight) hours as needed for nausea or vomiting. 20 tablet 0  . polyethylene glycol powder (MIRALAX) powder Take 8.5-34 g by mouth 2 (two) times daily as needed for mild constipation, moderate constipation or severe constipation. To correct constipation.  Adjust dose over 1-2 months.  Goal = ~1 bowel movement / day 255 g 0    No current facility-administered medications for this visit.     REVIEW OF SYSTEMS:  [X]  denotes positive finding, [ ]  denotes negative finding Cardiac  Comments:  Chest pain or chest pressure:    Shortness of breath upon exertion:    Short of breath when lying flat:    Irregular heart rhythm:        Vascular    Pain in calf, thigh, or hip brought on by ambulation: x   Pain in feet at night that wakes you up from your sleep:     Blood clot in your veins:    Leg swelling:         Pulmonary    Oxygen at home:    Productive cough:     Wheezing:         Neurologic    Sudden weakness in arms or legs:     Sudden numbness in arms or legs:     Sudden onset of difficulty speaking or slurred speech:    Temporary loss of vision in one eye:     Problems with dizziness:         Gastrointestinal    Blood in stool:     Vomited blood:         Genitourinary    Burning when urinating:     Blood in urine:        Psychiatric    Major depression:         Hematologic    Bleeding problems:    Problems with blood clotting too easily:        Skin    Rashes or ulcers:        Constitutional    Fever or chills:      PHYSICAL EXAM: Vitals:   06/04/18 1239  BP: 112/66  Pulse: 76  Resp: 18  Temp: 97.9 F (36.6 C)  SpO2: 100%  Weight: 126 lb 6.9 oz (57.3 kg)  Height: 5\' 6"  (1.676 m)    GENERAL: The patient is a well-nourished female, in no acute distress. The vital signs are documented above. CARDIOVASCULAR: Carotid arteries without bruits bilaterally.  2+ radial, 2+ femoral and 2+ dorsalis pedis pulses bilaterally.  Prominent surface veins on both feet with scattered small varicosities on her legs. PULMONARY: There is good air exchange  ABDOMEN: Soft and non-tender  MUSCULOSKELETAL: There are no major deformities or cyanosis. NEUROLOGIC: No focal weakness or paresthesias are detected. SKIN: There are no ulcers or rashes noted. PSYCHIATRIC: The patient has a normal  affect.  DATA:  Venous duplex today was reviewed with the patient.  He does have mild reflux in the left common femoral  vein at the saphenofemoral junction but no enlargement or incompetence in the remaining great saphenous vein or small saphenous vein  MEDICAL ISSUES: I discussed these findings with the patient.  Reassured her that she does not have any evidence of significant arterial or venous pathology.  I am unclear as to the etiology of her discomfort.  She was concerned that this may be of a more serious nature.  I suggested that she continue her exercise as she is currently doing and do not feel she is causing any harm when she has the mild discomfort in her left leg.  She will see us again on an as-needed basis   Larina Earthlyodd F. Sten Dematteo, MD [facility] Office Tel [phone] Pager [phone]

## 2018-08-06 DIAGNOSIS — H6123 Impacted cerumen, bilateral: Secondary | ICD-10-CM | POA: Diagnosis not present

## 2018-09-03 DIAGNOSIS — M2550 Pain in unspecified joint: Secondary | ICD-10-CM | POA: Diagnosis not present

## 2018-09-03 DIAGNOSIS — Z791 Long term (current) use of non-steroidal anti-inflammatories (NSAID): Secondary | ICD-10-CM | POA: Diagnosis not present

## 2018-09-03 DIAGNOSIS — L409 Psoriasis, unspecified: Secondary | ICD-10-CM | POA: Diagnosis not present

## 2018-09-03 DIAGNOSIS — M150 Primary generalized (osteo)arthritis: Secondary | ICD-10-CM | POA: Diagnosis not present

## 2018-09-10 DIAGNOSIS — M2550 Pain in unspecified joint: Secondary | ICD-10-CM | POA: Diagnosis not present

## 2019-03-31 ENCOUNTER — Other Ambulatory Visit: Payer: Self-pay

## 2019-03-31 DIAGNOSIS — Z20828 Contact with and (suspected) exposure to other viral communicable diseases: Secondary | ICD-10-CM | POA: Diagnosis not present

## 2019-03-31 DIAGNOSIS — Z20822 Contact with and (suspected) exposure to covid-19: Secondary | ICD-10-CM

## 2019-04-03 LAB — NOVEL CORONAVIRUS, NAA: SARS-CoV-2, NAA: NOT DETECTED

## 2019-07-08 DIAGNOSIS — L409 Psoriasis, unspecified: Secondary | ICD-10-CM | POA: Diagnosis not present

## 2019-07-08 DIAGNOSIS — Z6823 Body mass index (BMI) 23.0-23.9, adult: Secondary | ICD-10-CM | POA: Diagnosis not present

## 2019-07-08 DIAGNOSIS — M150 Primary generalized (osteo)arthritis: Secondary | ICD-10-CM | POA: Diagnosis not present

## 2019-07-08 DIAGNOSIS — M545 Low back pain: Secondary | ICD-10-CM | POA: Diagnosis not present

## 2019-07-08 DIAGNOSIS — M2550 Pain in unspecified joint: Secondary | ICD-10-CM | POA: Diagnosis not present

## 2020-03-03 DIAGNOSIS — M150 Primary generalized (osteo)arthritis: Secondary | ICD-10-CM | POA: Diagnosis not present

## 2020-03-03 DIAGNOSIS — M2550 Pain in unspecified joint: Secondary | ICD-10-CM | POA: Diagnosis not present

## 2020-03-03 DIAGNOSIS — L409 Psoriasis, unspecified: Secondary | ICD-10-CM | POA: Diagnosis not present

## 2020-03-03 DIAGNOSIS — Z6823 Body mass index (BMI) 23.0-23.9, adult: Secondary | ICD-10-CM | POA: Diagnosis not present

## 2020-07-08 DIAGNOSIS — Z1231 Encounter for screening mammogram for malignant neoplasm of breast: Secondary | ICD-10-CM | POA: Diagnosis not present

## 2020-08-31 DIAGNOSIS — L409 Psoriasis, unspecified: Secondary | ICD-10-CM | POA: Diagnosis not present

## 2020-08-31 DIAGNOSIS — Z6822 Body mass index (BMI) 22.0-22.9, adult: Secondary | ICD-10-CM | POA: Diagnosis not present

## 2020-08-31 DIAGNOSIS — M150 Primary generalized (osteo)arthritis: Secondary | ICD-10-CM | POA: Diagnosis not present

## 2020-08-31 DIAGNOSIS — M2550 Pain in unspecified joint: Secondary | ICD-10-CM | POA: Diagnosis not present

## 2020-08-31 DIAGNOSIS — Z791 Long term (current) use of non-steroidal anti-inflammatories (NSAID): Secondary | ICD-10-CM | POA: Diagnosis not present

## 2021-03-03 DIAGNOSIS — M7062 Trochanteric bursitis, left hip: Secondary | ICD-10-CM | POA: Diagnosis not present

## 2021-03-03 DIAGNOSIS — M150 Primary generalized (osteo)arthritis: Secondary | ICD-10-CM | POA: Diagnosis not present

## 2021-03-03 DIAGNOSIS — Z791 Long term (current) use of non-steroidal anti-inflammatories (NSAID): Secondary | ICD-10-CM | POA: Diagnosis not present

## 2021-03-03 DIAGNOSIS — Z6823 Body mass index (BMI) 23.0-23.9, adult: Secondary | ICD-10-CM | POA: Diagnosis not present

## 2021-03-03 DIAGNOSIS — L409 Psoriasis, unspecified: Secondary | ICD-10-CM | POA: Diagnosis not present

## 2021-03-03 DIAGNOSIS — M2550 Pain in unspecified joint: Secondary | ICD-10-CM | POA: Diagnosis not present

## 2021-10-06 DIAGNOSIS — R600 Localized edema: Secondary | ICD-10-CM | POA: Diagnosis not present

## 2021-10-06 DIAGNOSIS — M1991 Primary osteoarthritis, unspecified site: Secondary | ICD-10-CM | POA: Diagnosis not present

## 2021-10-06 DIAGNOSIS — M79641 Pain in right hand: Secondary | ICD-10-CM | POA: Diagnosis not present

## 2021-10-06 DIAGNOSIS — Z6823 Body mass index (BMI) 23.0-23.9, adult: Secondary | ICD-10-CM | POA: Diagnosis not present

## 2021-10-06 DIAGNOSIS — L409 Psoriasis, unspecified: Secondary | ICD-10-CM | POA: Diagnosis not present

## 2021-10-06 DIAGNOSIS — M79642 Pain in left hand: Secondary | ICD-10-CM | POA: Diagnosis not present

## 2021-10-06 DIAGNOSIS — Z791 Long term (current) use of non-steroidal anti-inflammatories (NSAID): Secondary | ICD-10-CM | POA: Diagnosis not present

## 2023-07-18 ENCOUNTER — Encounter: Payer: Self-pay | Admitting: Internal Medicine
